# Patient Record
Sex: Male | Born: 2000 | Race: Black or African American | Hispanic: No | Marital: Single | Smoking: Never smoker
Health system: Southern US, Community
[De-identification: ages and names within clinical notes are randomized; demographics above are authoritative.]

## PROBLEM LIST (undated history)

## (undated) ENCOUNTER — Emergency Department (HOSPITAL_COMMUNITY): Payer: Self-pay | Source: Home / Self Care

---
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
## 2020-03-04 ENCOUNTER — Emergency Department: Payer: Medicaid Other

## 2020-03-04 ENCOUNTER — Other Ambulatory Visit: Payer: Self-pay

## 2020-03-04 ENCOUNTER — Emergency Department
Admission: EM | Admit: 2020-03-04 | Discharge: 2020-03-04 | Disposition: A | Discharge status: Home / Self Care | Payer: Medicaid Other | Attending: Student in an Organized Health Care Education/Training Program | Admitting: Student in an Organized Health Care Education/Training Program

## 2020-03-04 DIAGNOSIS — M79605 Pain in left leg: Secondary | ICD-10-CM

## 2020-03-04 DIAGNOSIS — M25552 Pain in left hip: Secondary | ICD-10-CM

## 2020-03-04 NOTE — ED Provider Notes (Signed)
[facility] Emergency Department Provider Note   ____________________________________________   First MD Initiated Contact with Patient 03/04/20 1119     (approximate)  I have reviewed the triage vital signs and the nursing notes.   HISTORY  Chief Complaint Leg Pain   HPI Itay Mella is a 19 y.o. male presents to the ED with complaint of left thigh pain that started a few days ago. Patient states that it got better and then started hurting again this morning. Patient states he took ibuprofen 800 mg last evening. He denies any known injury to his leg. He states he has been doing a lot more walking than usual.         History reviewed. No pertinent past medical history.  There are no problems to display for this patient.   History reviewed. No pertinent surgical history.  Prior to Admission medications   Medication Sig Start Date End Date Taking? Authorizing Provider  fluticasone (FLONASE) 50 MCG/ACT nasal spray Place 1 spray into both nostrils 2 (two) times daily. 06/11/16 03/04/20  Hagler, Jami L, PA-C    Allergies Patient has no known allergies.  History reviewed. No pertinent family history.  Social History Social History   Tobacco Use  . Smoking status: Never Smoker  . Smokeless tobacco: Never Used  Substance Use Topics  . Alcohol use: No  . Drug use: No    Review of Systems Constitutional: No fever/chills Eyes: No visual changes. Cardiovascular: Denies chest pain. Respiratory: Denies shortness of breath. Gastrointestinal: No abdominal pain.  No nausea, no vomiting.  Genitourinary: Negative for dysuria. Musculoskeletal: Positive left hip/proximal femur pain. Skin: Negative for rash. Neurological: Negative for headaches, focal weakness or numbness.  ____________________________________________   PHYSICAL EXAM:  VITAL SIGNS: ED Triage Vitals [03/04/20 1113]  Enc Vitals Group     BP (!) 146/85     Pulse Rate 70      Resp 16     Temp 98.2 F (36.8 C)     Temp Source Oral     SpO2 97 %     Weight 150 lb (68 kg)     Height 5\' 7"  (1.702 m)     Head Circumference      Peak Flow      Pain Score 5     Pain Loc      Pain Edu?      Excl. in GC?    Constitutional: Alert and oriented. Well appearing and in no acute distress. Eyes: Conjunctivae are normal.  Head: Atraumatic. Neck: No stridor.   Cardiovascular: Normal rate, regular rhythm. Grossly normal heart sounds.  Good peripheral circulation. Respiratory: Normal respiratory effort.  No retractions. Lungs CTAB. Musculoskeletal: Examination of the left hip and thigh there is no gross deformity or soft tissue edema.  No evidence of injury.  Patient is able to flex and extend without difficulty and no difficulty was noted as patient was able to abduct and abduct.. Neurologic:  Normal speech and language. No gross focal neurologic deficits are appreciated.  Skin:  Skin is warm, dry and intact. No rash noted. Psychiatric: Mood and affect are normal. Speech and behavior are normal.  ____________________________________________   LABS (all labs ordered are listed, but only abnormal results are displayed)  Labs Reviewed - No data to display  RADIOLOGY   Official radiology report(s): DG HIP UNILAT WITH PELVIS 2-3 VIEWS LEFT  Result Date: 03/04/2020 CLINICAL DATA:  Left hip and thigh pain for a few days. No  known injury. EXAM: DG HIP (WITH OR WITHOUT PELVIS) 2-3V LEFT COMPARISON:  None. FINDINGS: There is no evidence of hip fracture or dislocation. There is no evidence of arthropathy or other focal bone abnormality. IMPRESSION: Normal exam. Electronically Signed   By: Drusilla Kanner M.D.   On: 03/04/2020 12:04    ____________________________________________   PROCEDURES  Procedure(s) performed (including Critical Care):  Procedures   ____________________________________________   INITIAL IMPRESSION / ASSESSMENT AND PLAN / ED COURSE  As  part of my medical decision making, I reviewed the following data within the electronic MEDICAL RECORD NUMBER Notes from prior ED visits and [place] Controlled Substance Database  19 year old male presents to the ED with complaint of left upper thigh and hip pain without history of injury.  Patient states that he took ibuprofen once last evening and is not having as much pain as he has for the last 3 days.  Physical exam was benign.  X-ray was negative and patient was made aware.  He will continue taking ibuprofen at home every 8 hours with food.  He is to follow-up with his PCP if any continued problems.  ____________________________________________   FINAL CLINICAL IMPRESSION(S) / ED DIAGNOSES  Final diagnoses:  Left leg pain     ED Discharge Orders    None       Note:  This document was prepared using Dragon voice recognition software and may include unintentional dictation errors.    Tommi Rumps, PA-C 03/04/20 1321    Willy Eddy, MD 03/04/20 1527

## 2020-03-04 NOTE — ED Triage Notes (Signed)
Pt A&O, ambulatory. C/o L thigh pain that started a few days ago, went away, and came back this morning. Denies injury.

## 2020-03-04 NOTE — Discharge Instructions (Signed)
Follow-up with your primary care provider if any continued problems.  Continue taking ibuprofen every 8 hours as needed for leg pain.  You may try using ice or heat to your muscles as needed for discomfort.

## 2020-03-04 NOTE — ED Notes (Signed)
Pt taken to xray via stretcher  

## 2021-03-27 IMAGING — CR DG HIP (WITH OR WITHOUT PELVIS) 2-3V*L*
1 series · 3 of 3 positions shown · non-contrast
Comparison: None.

CLINICAL DATA: Left hip and thigh pain for a few days. No known
injury.

EXAM:
DG HIP (WITH OR WITHOUT PELVIS) 2-3V LEFT

[Series 1: dg hip unilat w or w/o pelvis 1v left · non-contrast · 0.14mm/px · 3 of 3 slices shown]
[im 1/3]
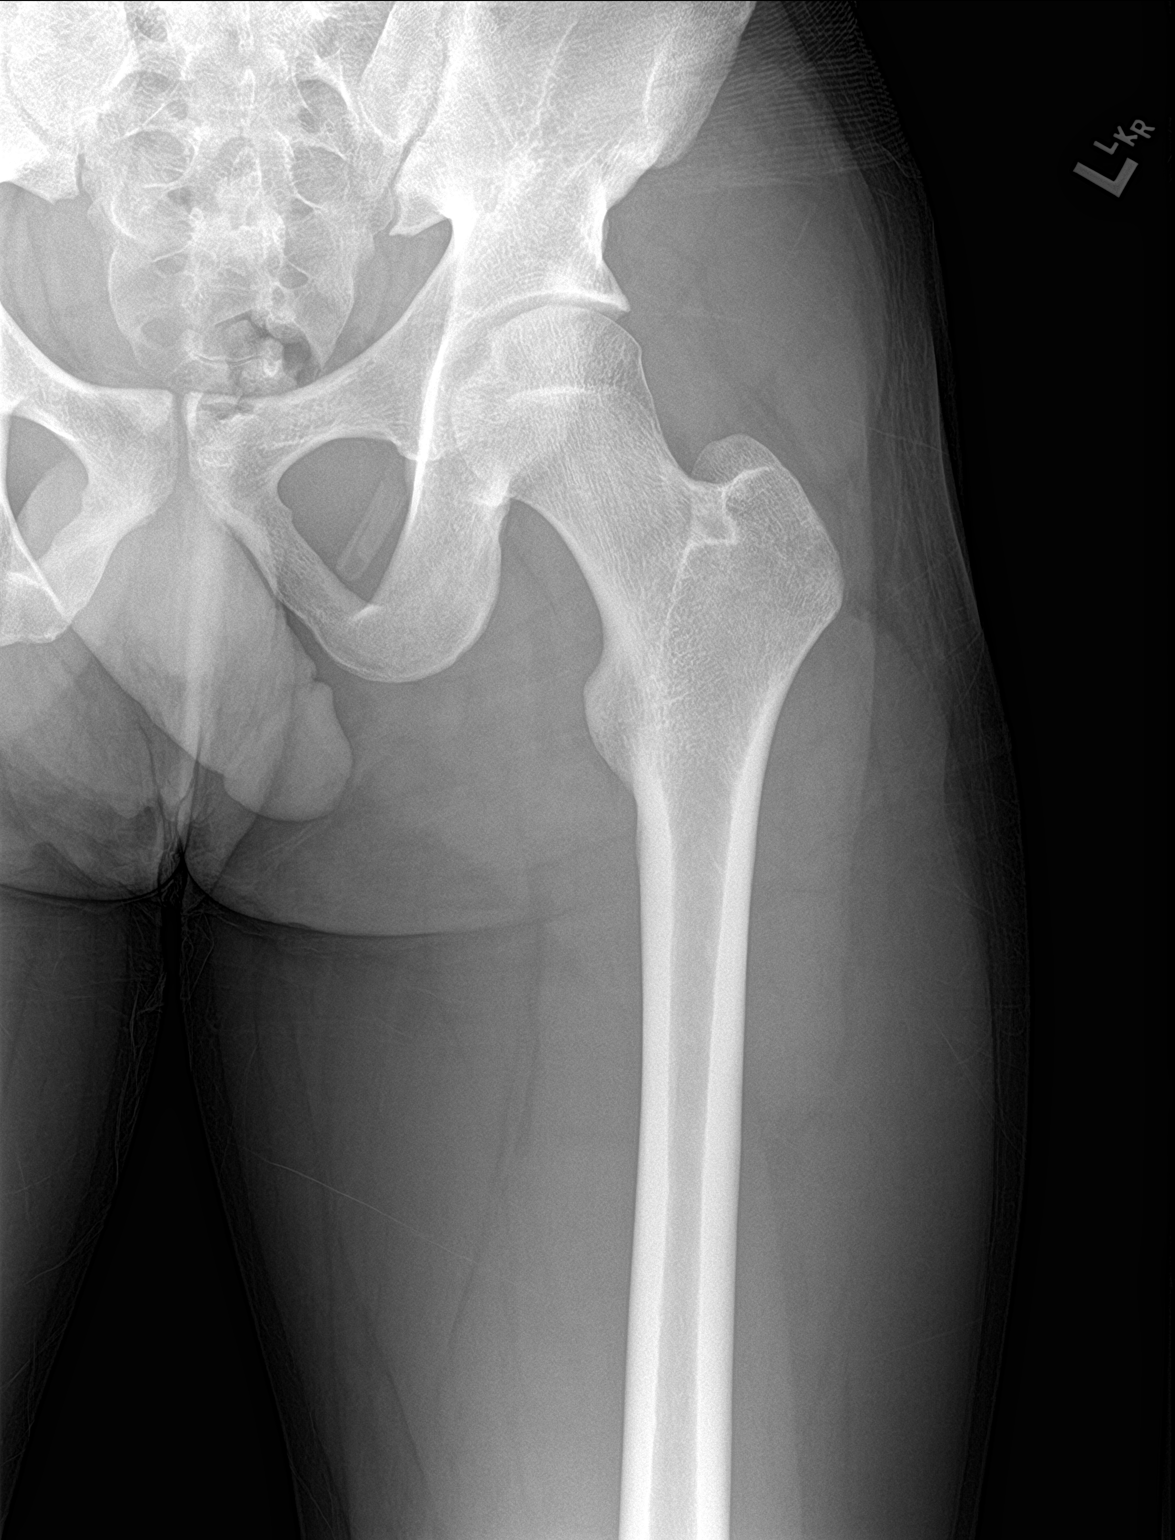
[im 2/3]
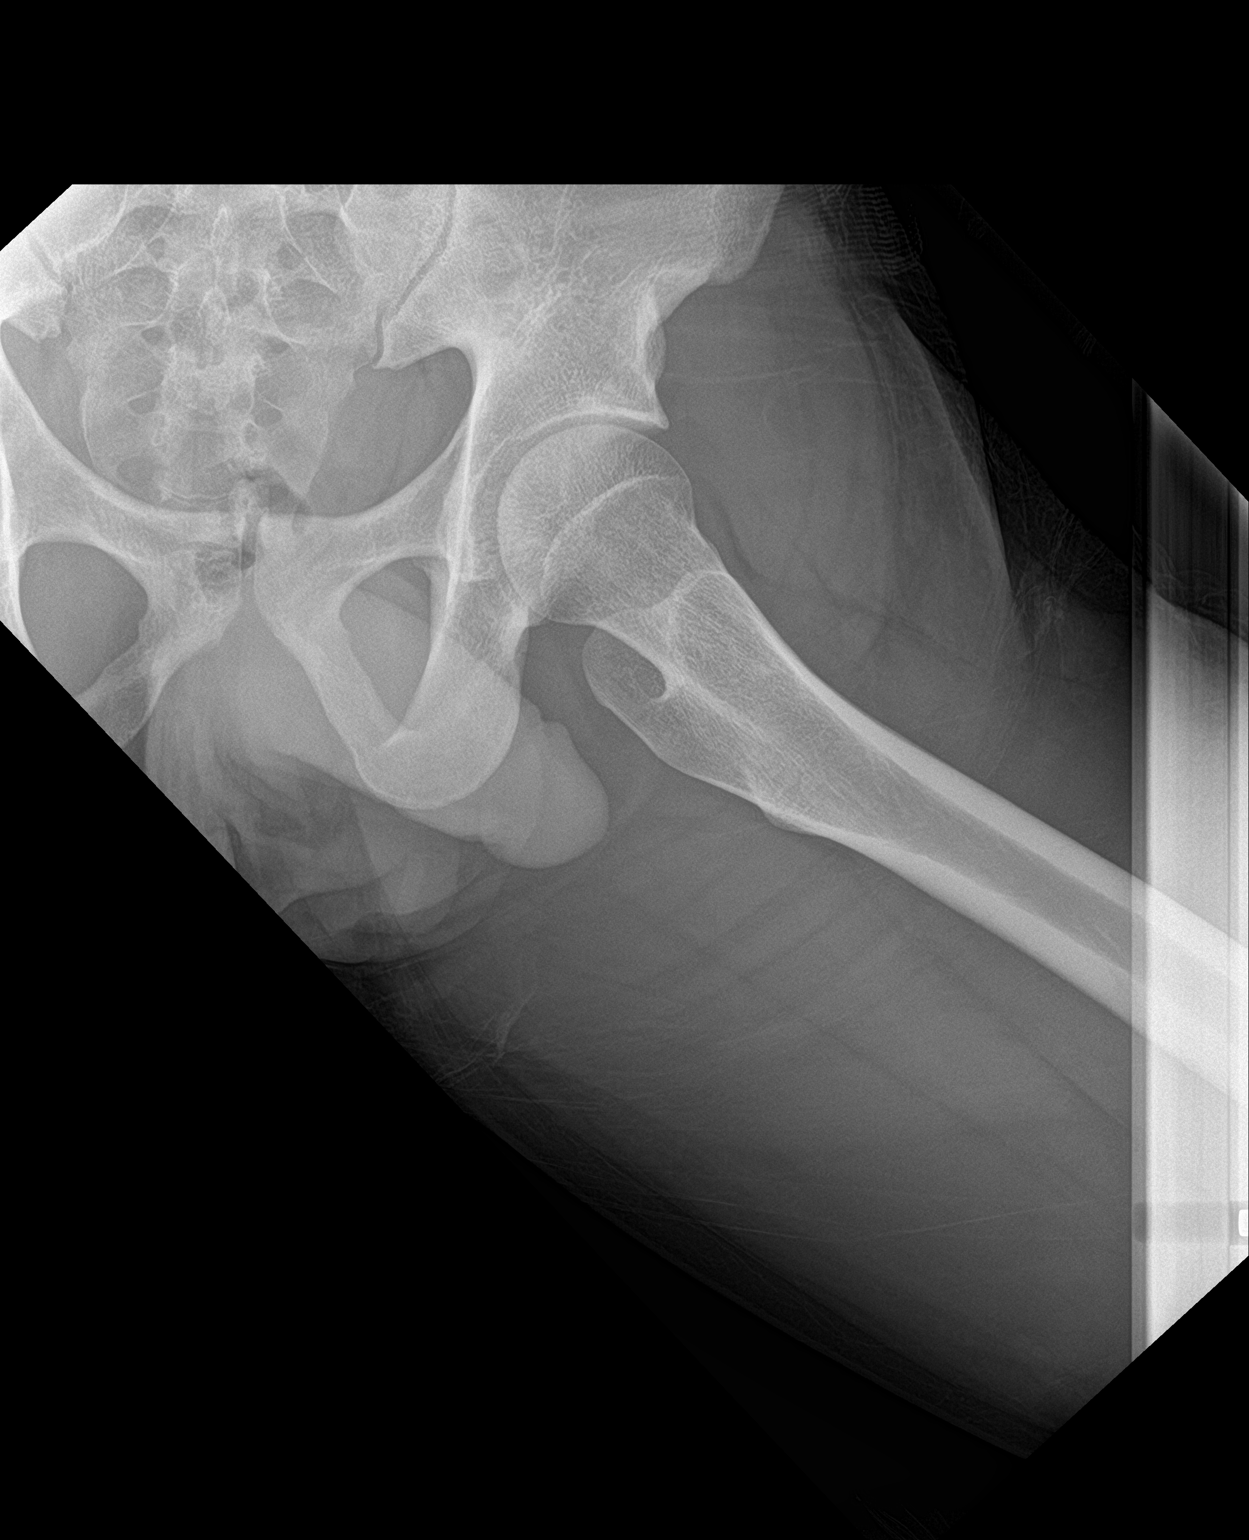
[im 3/3]
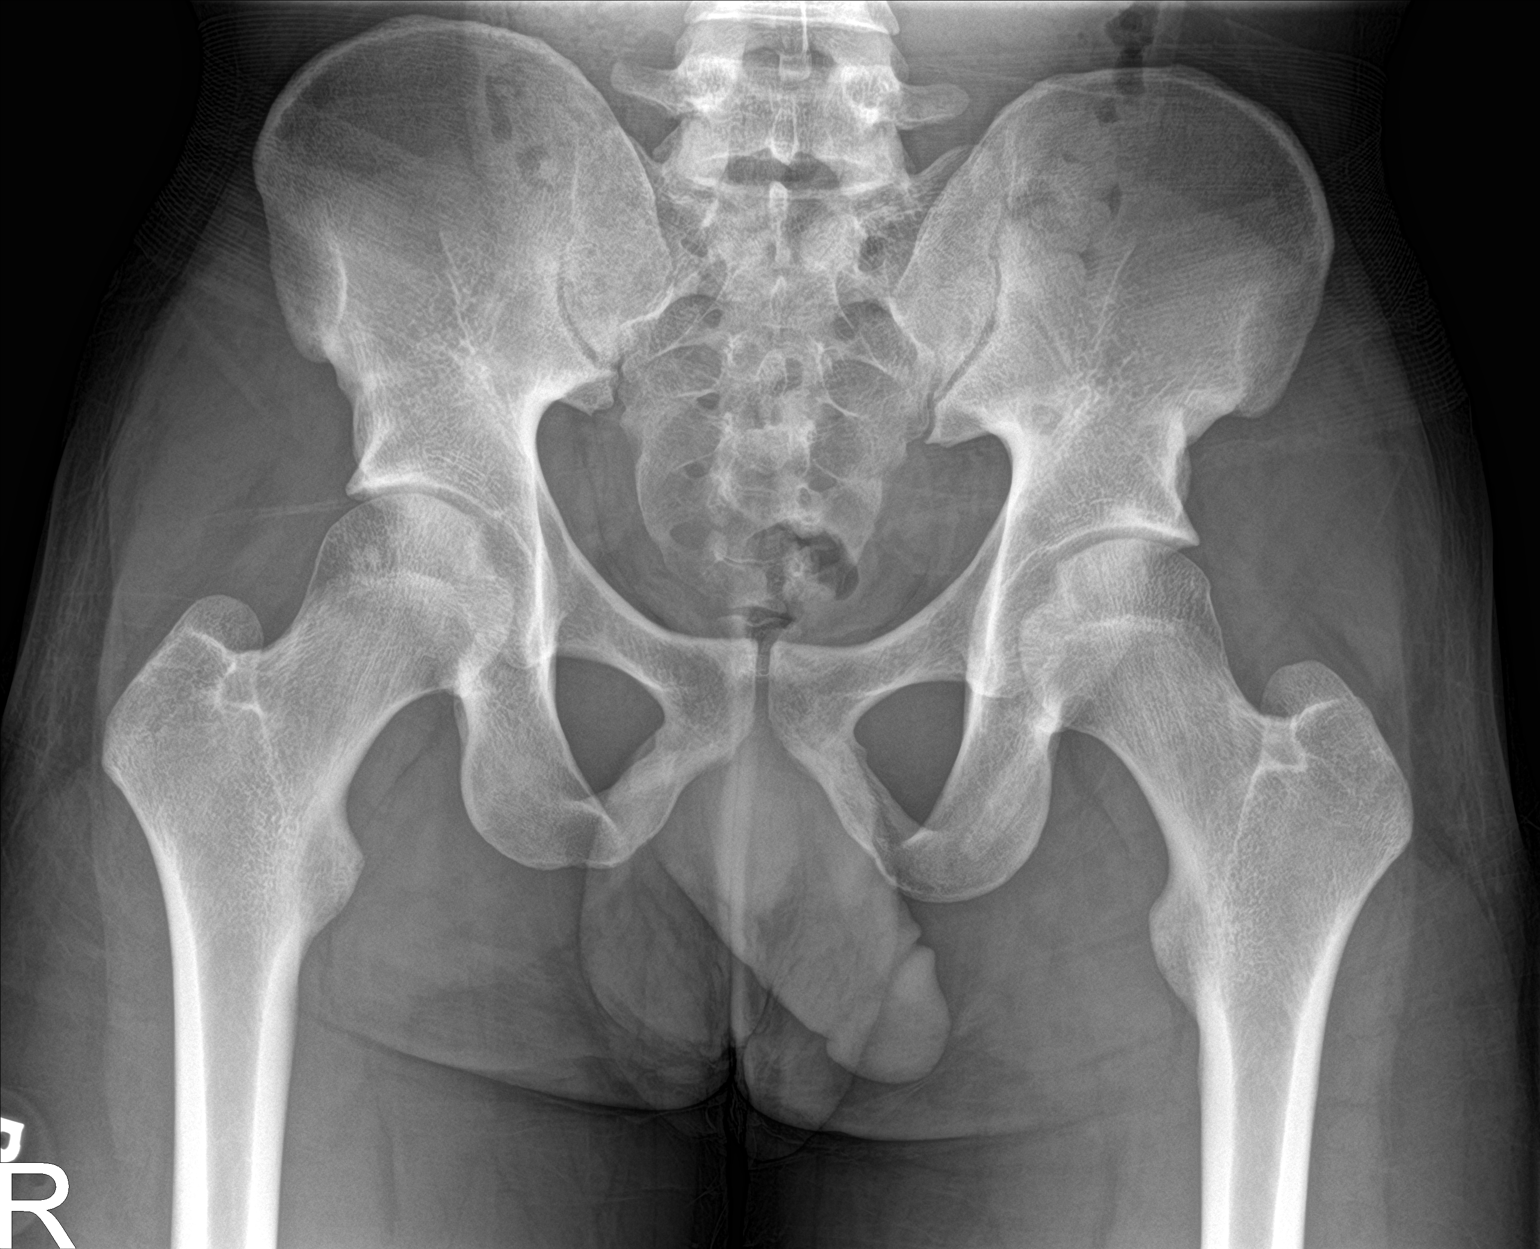

[3 of 3 positions shown; findings below may reference images not displayed]

FINDINGS: There is no evidence of hip fracture or dislocation. There is no
evidence of arthropathy or other focal bone abnormality.
IMPRESSION: Normal exam.

## 2022-03-25 ENCOUNTER — Emergency Department
Admission: EM | Admit: 2022-03-25 | Discharge: 2022-03-25 | Disposition: A | Discharge status: Home / Self Care | Payer: Medicaid Other | Attending: Emergency Medicine | Admitting: Emergency Medicine

## 2022-03-25 ENCOUNTER — Other Ambulatory Visit: Payer: Self-pay

## 2022-03-25 DIAGNOSIS — M546 Pain in thoracic spine: Secondary | ICD-10-CM | POA: Diagnosis present

## 2022-03-25 DIAGNOSIS — S29012A Strain of muscle and tendon of back wall of thorax, initial encounter: Secondary | ICD-10-CM

## 2022-03-25 DIAGNOSIS — S29019A Strain of muscle and tendon of unspecified wall of thorax, initial encounter: Secondary | ICD-10-CM | POA: Diagnosis not present

## 2022-03-25 DIAGNOSIS — X500XXA Overexertion from strenuous movement or load, initial encounter: Secondary | ICD-10-CM | POA: Diagnosis not present

## 2022-03-25 MED ORDER — KETOROLAC TROMETHAMINE 30 MG/ML IJ SOLN
30.0000 mg | Freq: Once | INTRAMUSCULAR | Status: AC
  Administered 2022-03-25: 30 mg via INTRAMUSCULAR
  Filled 2022-03-25: qty 1

## 2022-03-25 MED ORDER — ACETAMINOPHEN 500 MG PO TABS
1000.0000 mg | ORAL_TABLET | Freq: Once | ORAL | Status: AC
  Administered 2022-03-25: 1000 mg via ORAL
  Filled 2022-03-25: qty 2

## 2022-03-25 MED ORDER — METHOCARBAMOL 500 MG PO TABS: 500.0000 mg | ORAL_TABLET | Freq: Three times a day (TID) | ORAL | 0 refills | Status: DC | PRN

## 2022-03-25 MED ORDER — METHOCARBAMOL 500 MG PO TABS
500.0000 mg | ORAL_TABLET | Freq: Once | ORAL | Status: AC
  Administered 2022-03-25: 500 mg via ORAL
  Filled 2022-03-25: qty 1

## 2022-03-25 NOTE — Discharge Instructions (Addendum)
Please take Tylenol and ibuprofen/Advil for your pain.  It is safe to take them together, or to alternate them every few hours.  Take up to 1000mg  of Tylenol at a time, up to 4 times per day.  Do not take more than 4000 mg of Tylenol in 24 hours.  For ibuprofen, take 400-600 mg, 3 - 4 times per day.  Use the methocarbamol/Robaxin muscle relaxer as needed for more severe spasms.  This medication may make you little sleepy so be careful and do not drive or operate machinery with this.

## 2022-03-25 NOTE — ED Provider Notes (Signed)
[facility] Provider Note    Event Date/Time   First MD Initiated Contact with Patient 03/25/22 [phone]     (approximate)   History   Back Pain   HPI  Gary Graves is a 21 y.o. male who presents to the ED for evaluation of Back Pain   Patient presents to the ED with his father and sister for the evaluation of subacute thoracic back pain.  He reports no discrete injuries, but does admit to recently starting deadlift weight lifting 2 days prior to the initiation of his pain.  He denies any falls, MVC or discrete injuries.  Reports that he is not familiar with deadlifts, but in the span of 1 or 2 days he went from trying to learn to trying to dead lift over 300 pounds.  He reports thoracic back pain since that time, worse at night when he is trying to sleep, and improved during the daytime when he is up and moving.  Denies any fever, IVDU, urinary or stool retention or saddle anesthesias.   Physical Exam   Triage Vital Signs: ED Triage Vitals [03/25/22 0429]  Enc Vitals Group     BP (!) 136/90     Pulse Rate 77     Resp 16     Temp 97.8 F (36.6 C)     Temp src      SpO2 100 %     Weight 175 lb (79.4 kg)     Height 5\' 8"  (1.727 m)     Head Circumference      Peak Flow      Pain Score 7     Pain Loc      Pain Edu?      Excl. in [place]?     Most recent vital signs: Vitals:   03/25/22 0429  BP: (!) 136/90  Pulse: 77  Resp: 16  Temp: 97.8 F (36.6 C)  SpO2: 100%    General: Awake, no distress.  Pleasant and conversational.  Well-appearing.  Ambulatory with normal gait.  Springs up from the stretcher himself to stand and able to take his zip up putting off by himself without difficulty CV:  Good peripheral perfusion.  Resp:  Normal effort.  Abd:  No distention.  MSK:  No deformity noted.  Minimal paraspinal thoracic tenderness to palpation without any midline tenderness, step-offs or signs of trauma to the back. Neuro:  No focal deficits  appreciated. Cranial nerves II through XII intact 5/5 strength and sensation in all 4 extremities Other:     ED Results / Procedures / Treatments   Labs (all labs ordered are listed, but only abnormal results are displayed) Labs Reviewed - No data to display  EKG   RADIOLOGY   Official radiology report(s): No results found.  PROCEDURES and INTERVENTIONS:  Procedures  Medications  ketorolac (TORADOL) 30 MG/ML injection 30 mg (has no administration in time range)  methocarbamol (ROBAXIN) tablet 500 mg (has no administration in time range)  acetaminophen (TYLENOL) tablet 1,000 mg (has no administration in time range)     IMPRESSION / MDM / ASSESSMENT AND PLAN / ED COURSE  I reviewed the triage vital signs and the nursing notes.  Differential diagnosis includes, but is not limited to, thoracic strain, cauda equina syndrome, cord compression, PE  {Patient presents with symptoms of an acute illness or injury that is potentially life-threatening.  Otherwise healthy 21 year old male presents to the ED with atraumatic thoracic back pain shortly, likely a  muscular strain and is suitable for outpatient management nonnarcotic multimodal analgesia.  I educated the patient on safe weightlifting practices and to ensure he practices his forearm thoroughly and slowly advance to help prevent injury.  I provided a prescription for methocarbamol and we discussed Tylenol and ibuprofen.  We discussed appropriate return precautions for the ED and he is suitable for outpatient management.      FINAL CLINICAL IMPRESSION(S) / ED DIAGNOSES   Final diagnoses:  Strain of thoracic back region     Rx / DC Orders   ED Discharge Orders          Ordered    methocarbamol (ROBAXIN) 500 MG tablet  Every 8 hours PRN        03/25/22 0537             Note:  This document was prepared using Dragon voice recognition software and may include unintentional dictation errors.   Delton Prairie,  MD 03/25/22 [phone]

## 2022-03-25 NOTE — ED Triage Notes (Signed)
Pt states mid back pain for a week. Pt states pain is worse at night and feels better when he is moving around. Pt denies cough, shob, fever. Pt ambulatory without distress.

## 2022-09-04 ENCOUNTER — Other Ambulatory Visit: Payer: Self-pay

## 2022-09-04 ENCOUNTER — Emergency Department: Payer: Medicaid Other

## 2022-09-04 ENCOUNTER — Emergency Department
Admission: EM | Admit: 2022-09-04 | Discharge: 2022-09-04 | Disposition: A | Discharge status: Home / Self Care | Payer: Medicaid Other | Attending: Emergency Medicine | Admitting: Emergency Medicine

## 2022-09-04 DIAGNOSIS — M25452 Effusion, left hip: Secondary | ICD-10-CM | POA: Insufficient documentation

## 2022-09-04 DIAGNOSIS — M25552 Pain in left hip: Secondary | ICD-10-CM | POA: Diagnosis present

## 2022-09-04 LAB — CBC WITH DIFFERENTIAL/PLATELET
Abs Immature Granulocytes: 0.02 10*3/uL (ref 0.00–0.07)
Basophils Absolute: 0 10*3/uL (ref 0.0–0.1)
Basophils Relative: 0 %
Eosinophils Absolute: 0.3 10*3/uL (ref 0.0–0.5)
Eosinophils Relative: 4 %
HCT: 40 % (ref 39.0–52.0)
Hemoglobin: 13.4 g/dL (ref 13.0–17.0)
Immature Granulocytes: 0 %
Lymphocytes Relative: 30 %
Lymphs Abs: 1.9 10*3/uL (ref 0.7–4.0)
MCH: 28 pg (ref 26.0–34.0)
MCHC: 33.5 g/dL (ref 30.0–36.0)
MCV: 83.5 fL (ref 80.0–100.0)
Monocytes Absolute: 0.5 10*3/uL (ref 0.1–1.0)
Monocytes Relative: 7 %
Neutro Abs: 3.6 10*3/uL (ref 1.7–7.7)
Neutrophils Relative %: 59 %
Platelets: 246 10*3/uL (ref 150–400)
RBC: 4.79 MIL/uL (ref 4.22–5.81)
RDW: 11.9 % (ref 11.5–15.5)
WBC: 6.2 10*3/uL (ref 4.0–10.5)
nRBC: 0 % (ref 0.0–0.2)

## 2022-09-04 LAB — SEDIMENTATION RATE: Sed Rate: 14 mm/hr (ref 0–15)

## 2022-09-04 LAB — BASIC METABOLIC PANEL
Anion gap: 7 (ref 5–15)
BUN: 17 mg/dL (ref 6–20)
CO2: 27 mmol/L (ref 22–32)
Calcium: 9.3 mg/dL (ref 8.9–10.3)
Chloride: 104 mmol/L (ref 98–111)
Creatinine, Ser: 0.95 mg/dL (ref 0.61–1.24)
GFR, Estimated: >60 mL/min (ref >60)
Glucose, Bld: 84 mg/dL (ref 70–99)
Potassium: 3.8 mmol/L (ref 3.5–5.1)
Sodium: 138 mmol/L (ref 135–145)

## 2022-09-04 LAB — CK: Total CK: 241 U/L (ref 49–397)

## 2022-09-04 MED ORDER — HYDROCODONE-ACETAMINOPHEN 5-325 MG PO TABS: 1.0000 | ORAL_TABLET | Freq: Four times a day (QID) | ORAL | 0 refills | Status: DC | PRN

## 2022-09-04 MED ORDER — KETOROLAC TROMETHAMINE 30 MG/ML IJ SOLN
15.0000 mg | Freq: Once | INTRAMUSCULAR | Status: AC
  Administered 2022-09-04: 15 mg via INTRAVENOUS
  Filled 2022-09-04: qty 1

## 2022-09-04 MED ORDER — NAPROXEN 500 MG PO TABS: 500.0000 mg | ORAL_TABLET | Freq: Two times a day (BID) | ORAL | 0 refills | Status: DC

## 2022-09-04 NOTE — ED Triage Notes (Signed)
Complaining of left hip pain, started 4 days ago, denies any injury, hurts to move it whether he is walking or sitting.

## 2022-09-04 NOTE — ED Provider Notes (Signed)
[facility] Provider Note    Event Date/Time   First MD Initiated Contact with Patient 09/04/22 [phone]     (approximate)   History   Hip Pain   HPI  Gary Graves is a 22 y.o. male who presents to the ED from home with a chief complaint of nontraumatic left hip pain which began 4 days ago.  Patient awoke tonight with pain.  Hurts to move with any position, walking or sitting.  Denies fever/chills, chest pain, shortness of breath, abdominal pain, nausea or vomiting.  Does lift heavy things at work.  Took a car ride 6 hours to [place] 2 weeks ago.  Denies fall/injury/trauma.     Past Medical History  History reviewed. No pertinent past medical history.   Active Problem List  There are no problems to display for this patient.    Past Surgical History  History reviewed. No pertinent surgical history.   Home Medications   Prior to Admission medications   Medication Sig Start Date End Date Taking? Authorizing Provider  methocarbamol (ROBAXIN) 500 MG tablet Take 1 tablet (500 mg total) by mouth every 8 (eight) hours as needed for muscle spasms. 03/25/22   Vladimir Crofts, MD  fluticasone (FLONASE) 50 MCG/ACT nasal spray Place 1 spray into both nostrils 2 (two) times daily. 06/11/16 03/04/20  Hagler, Jami L, PA-C     Allergies  Patient has no known allergies.   Family History  History reviewed. No pertinent family history.   Physical Exam  Triage Vital Signs: ED Triage Vitals  Enc Vitals Group     BP 09/04/22 0359 (!) 138/91     Pulse Rate 09/04/22 0359 81     Resp 09/04/22 0359 20     Temp 09/04/22 0359 97.8 F (36.6 C)     Temp Source 09/04/22 0359 Oral     SpO2 09/04/22 0359 97 %     Weight 09/04/22 0401 170 lb (77.1 kg)     Height 09/04/22 0401 5\' 7"  (1.702 m)     Head Circumference --      Peak Flow --      Pain Score 09/04/22 0418 8     Pain Loc --      Pain Edu? --      Excl. in [place]? --     Updated Vital Signs: BP 134/82   Pulse  77   Temp 97.8 F (36.6 C) (Oral)   Resp 18   Ht 5\' 7"  (1.702 m)   Wt 77.1 kg   SpO2 100%   BMI 26.63 kg/m    General: Awake, no distress.  CV:  RRR.  Good peripheral perfusion.  Resp:  Normal effort.  TAB. Abd:  No distention.  Other:  Pelvis is stable.  Left inner thigh tender to palpation.  No inguinal hernia palpated.  2+ femoral pulse.  Full range of motion left hip with some pain.  Calf is supple without tenderness to palpation.   ED Results / Procedures / Treatments  Labs (all labs ordered are listed, but only abnormal results are displayed) Labs Reviewed  BASIC METABOLIC PANEL  CK  CBC WITH DIFFERENTIAL/PLATELET  CBC WITH DIFFERENTIAL/PLATELET  SEDIMENTATION RATE     EKG  None   RADIOLOGY I have independently visualized and interpreted patient's x-ray and ultrasound as well as noted the radiology interpretation:  Left hip x-ray: Negative  DVT ultrasound: No DVT; small left hip joint effusion  Official radiology report(s): US Venous Img  Lower Unilateral Left (DVT)  Result Date: 09/04/2022 CLINICAL DATA:  Hip pain after running. EXAM: LEFT LOWER EXTREMITY VENOUS DOPPLER ULTRASOUND TECHNIQUE: Gray-scale sonography with compression, as well as color and duplex ultrasound, were performed to evaluate the deep venous system(s) from the level of the common femoral vein through the popliteal and proximal calf veins. COMPARISON:  None Available. FINDINGS: VENOUS Normal compressibility of the common femoral, superficial femoral, and popliteal veins, as well as the visualized calf veins. Visualized portions of profunda femoral vein and great saphenous vein unremarkable. No filling defects to suggest DVT on grayscale or color Doppler imaging. Doppler waveforms show normal direction of venous flow, normal respiratory plasticity and response to augmentation. Limited views of the contralateral common femoral vein are unremarkable. OTHER Small joint effusion noted within the left  hip Limitations: none IMPRESSION: 1. No evidence of left lower extremity DVT. 2. Small left hip joint effusion. Electronically Signed   By: Kerby Moors M.D.   On: 09/04/2022 06:10   DG Hip Unilat With Pelvis 2-3 Views Left  Result Date: 09/04/2022 CLINICAL DATA:  Pain within the left hip. EXAM: DG HIP (WITH OR WITHOUT PELVIS) 2-3V LEFT COMPARISON:  03/04/2020 FINDINGS: No signs of acute fracture or dislocation. No evidence for pelvic diastasis. No significant arthropathy identified or other focal bone abnormality. IMPRESSION: Negative. Electronically Signed   By: Kerby Moors M.D.   On: 09/04/2022 05:39     PROCEDURES:  Critical Care performed: No  Procedures   MEDICATIONS ORDERED IN ED: Medications  ketorolac (TORADOL) 30 MG/ML injection 15 mg (15 mg Intravenous Given 09/04/22 0554)     IMPRESSION / MDM / ASSESSMENT AND PLAN / ED COURSE  I reviewed the triage vital signs and the nursing notes.                             22 year old male presenting with nontraumatic left hip/thigh pain.  Differential diagnosis includes but is not limited to fracture/dislocation, infectious/inflammatory, DVT, etc.  I have personally reviewed patient's records and note his last visit was in the ED in August 2023 for thoracic back pain.  Patient's presentation is most consistent with acute presentation with potential threat to life or bodily function.  Will obtain lab work including sed rate, DVT ultrasound.  Administer IV ketorolac and reassess.  Clinical Course as of 09/04/22 0651  Mon Sep 04, 2022  8416 Pain improved.  Patient was able to get a nap.  Updated patient on all lab work and imaging studies which are unremarkable.  Pending sed rate which would likely be unremarkable.  Will discharge home with as needed prescriptions for Naprosyn and Norco and patient will follow-up with orthopedics.  Tricked return precautions given.  Patient verbalizes understanding and agrees with plan of care. [JS]     Clinical Course User Index [JS] Paulette Blanch, MD     FINAL CLINICAL IMPRESSION(S) / ED DIAGNOSES   Final diagnoses:  Left hip pain  Hip joint effusion, left     Rx / DC Orders   ED Discharge Orders     None        Note:  This document was prepared using Dragon voice recognition software and may include unintentional dictation errors.   Paulette Blanch, MD 09/04/22 [phone]

## 2022-09-04 NOTE — Discharge Instructions (Addendum)
Take Naprosyn twice daily as prescribed for pain.  You may take Norco as needed for more severe pain.  Return to the ER for worsening symptoms, fever, redness/swelling or other concerns.

## 2023-01-12 ENCOUNTER — Ambulatory Visit
Admission: EM | Admit: 2023-01-12 | Discharge: 2023-01-12 | Disposition: A | Discharge status: Home / Self Care | Payer: Medicaid Other | Attending: Urgent Care | Admitting: Urgent Care

## 2023-01-12 DIAGNOSIS — Z23 Encounter for immunization: Secondary | ICD-10-CM | POA: Diagnosis not present

## 2023-01-12 DIAGNOSIS — S91331A Puncture wound without foreign body, right foot, initial encounter: Secondary | ICD-10-CM

## 2023-01-12 MED ORDER — TETANUS-DIPHTH-ACELL PERTUSSIS 5-2.5-18.5 LF-MCG/0.5 IM SUSY
0.5000 mL | PREFILLED_SYRINGE | Freq: Once | INTRAMUSCULAR | Status: AC
  Administered 2023-01-12: 0.5 mL via INTRAMUSCULAR

## 2023-01-12 MED ORDER — LEVOFLOXACIN 500 MG PO TABS: 500.0000 mg | ORAL_TABLET | Freq: Every day | ORAL | 0 refills | Status: AC

## 2023-01-12 NOTE — ED Triage Notes (Signed)
Patient presents to [facility] for right foot injury today. He states he stepped on a nail this morning. Last Tdap unknown.

## 2023-01-12 NOTE — ED Provider Notes (Signed)
Gary Graves    CSN: [id number] Arrival date & time: 01/12/23  1304      History   Chief Complaint Chief Complaint  Patient presents with   Foot Injury    HPI Gary Graves is a 22 y.o. male.   HPI  Patient presents to urgent care after right foot injury today when he stepped on a nail and punctured his foot.  Last tetanus unknown.  History reviewed. No pertinent past medical history.  There are no problems to display for this patient.   History reviewed. No pertinent surgical history.     Home Medications    Prior to Admission medications   Medication Sig Start Date End Date Taking? Authorizing Provider  HYDROcodone-acetaminophen (NORCO) 5-325 MG tablet Take 1 tablet by mouth every 6 (six) hours as needed for moderate pain. 09/04/22   Irean Hong, MD  methocarbamol (ROBAXIN) 500 MG tablet Take 1 tablet (500 mg total) by mouth every 8 (eight) hours as needed for muscle spasms. 03/25/22   Delton Prairie, MD  naproxen (NAPROSYN) 500 MG tablet Take 1 tablet (500 mg total) by mouth 2 (two) times daily with a meal. 09/04/22   Irean Hong, MD  fluticasone (FLONASE) 50 MCG/ACT nasal spray Place 1 spray into both nostrils 2 (two) times daily. 06/11/16 03/04/20  Hagler, Ernestene Kiel, PA-C    Family History History reviewed. No pertinent family history.  Social History Social History   Tobacco Use   Smoking status: Never   Smokeless tobacco: Never  Substance Use Topics   Alcohol use: No   Drug use: No     Allergies   Patient has no known allergies.   Review of Systems Review of Systems   Physical Exam Triage Vital Signs ED Triage Vitals [01/12/23 1320]  Enc Vitals Group     BP 122/82     Pulse Rate 92     Resp 18     Temp 98.4 F (36.9 C)     Temp Source Oral     SpO2 98 %     Weight      Height      Head Circumference      Peak Flow      Pain Score      Pain Loc      Pain Edu?      Excl. in [place]?    No data found.  Updated Vital Signs BP  122/82 (BP Location: Left Arm)   Pulse 92   Temp 98.4 F (36.9 C) (Oral)   Resp 18   SpO2 98%   Visual Acuity Right Eye Distance:   Left Eye Distance:   Bilateral Distance:    Right Eye Near:   Left Eye Near:    Bilateral Near:     Physical Exam   UC Treatments / Results  Labs (all labs ordered are listed, but only abnormal results are displayed) Labs Reviewed - No data to display  EKG   Radiology No results found.  Procedures Procedures (including critical care time)  Medications Ordered in UC Medications - No data to display  Initial Impression / Assessment and Plan / UC Course  I have reviewed the triage vital signs and the nursing notes.  Pertinent labs & imaging results that were available during my care of the patient were reviewed by me and considered in my medical decision making (see chart for details).   Puncture wound to plantar surface of right foot.  Wound needed  with normal saline and Cleaned with Betadine.  treating prophylactically with Staph aureus, strep and P. aeruginosa coverage per protocol with levofloxacin.  Td booster administered.  Reviewed chart history.   Counseled patient on potential for adverse effects with medications prescribed/recommended today, ER and return-to-clinic precautions discussed, patient verbalized understanding and agreement with care plan.    Final Clinical Impressions(s) / UC Diagnoses   Final diagnoses:  None   Discharge Instructions   None    ED Prescriptions   None    PDMP not reviewed this encounter.   Charma Igo, [place] 01/12/23 1330

## 2023-01-12 NOTE — Discharge Instructions (Signed)
Follow up here or with your primary care provider if your symptoms are worsening or not improving.     

## 2023-10-17 ENCOUNTER — Ambulatory Visit: Payer: Medicaid Other | Attending: Primary Care

## 2023-10-17 DIAGNOSIS — M5459 Other low back pain: Secondary | ICD-10-CM | POA: Diagnosis present

## 2023-10-17 DIAGNOSIS — M25551 Pain in right hip: Secondary | ICD-10-CM | POA: Insufficient documentation

## 2023-10-17 DIAGNOSIS — M6281 Muscle weakness (generalized): Secondary | ICD-10-CM | POA: Diagnosis present

## 2023-10-17 NOTE — Therapy (Signed)
 OUTPATIENT PHYSICAL THERAPY THORACOLUMBAR EVALUATION   Patient Name: Gary Graves MRN: [id number] DOB:May 15, 2001, 23 y.o., male Today's Date: 10/17/2023  END OF SESSION:  PT End of Session - 10/17/23 1918     Visit Number 1    Number of Visits 9    Date for PT Re-Evaluation 12/12/23    PT Start Time 1114    PT Stop Time 1158    PT Time Calculation (min) 44 min    Activity Tolerance Patient tolerated treatment well    Behavior During Therapy [facility] for tasks assessed/performed             History reviewed. No pertinent past medical history. History reviewed. No pertinent surgical history. There are no active problems to display for this patient.   PCP: Sandrea Hughs, NP  REFERRING PROVIDER: Sandrea Hughs, NP  REFERRING DIAG: [phone] (ICD-10-CM) - Right shoulder pain M54.50 (ICD-10-CM) - Low back pain   Rationale for Evaluation and Treatment: Rehabilitation  THERAPY DIAG:  Other low back pain  Pain in right hip  Muscle weakness (generalized)  ONSET DATE: ~1 month ago recent flare up. Has dealt with this for ~4 years.   SUBJECTIVE:                                                                                                                                                                                           SUBJECTIVE STATEMENT: Pt is a 23 y.o. male referred to OPPT for R shoulder pain and LBP.  PERTINENT HISTORY:  Pt reports R shoulder is no longer an issue. Reports gradual onset of R sided LBP and "sciatica". Gradual onset, no MOI. Noticed symptoms initially with prolonged sitting and pain with getting up. 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 This is still the case with most recent flare up. Normal to have increased pain every few months. Now trying to get it fixed so he doesn't have to keep dealing with flare ups. Reports lower back stiffness and onset of sharp pain in posterolateral glutes. Reports referred pain to knee  posteriorly and laterally. Sharp pain also there. Painful activities include prolonged sitting and walking on inclines. Pain begins with sitting for an hour or two. 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 Pain is worse in the mornings. Worst pain described upwards to 5-7/10 NPS. Initially was 9/10 a few weeks ago. Denies N/T and sensation changes. Denies B/B changes. No saddle anesthesia.   PAIN:  Are you having pain? Yes: NPRS scale: none currently Pain location: R hip, lower back  Pain description: Sharp Aggravating factors: Morning time,Prolonged sitting,work tasks, walking on inclines Relieving factors: Activity initially improves  PRECAUTIONS: None  RED FLAGS: None   WEIGHT BEARING RESTRICTIONS: No  FALLS:  Has patient fallen in last 6 months? No  OCCUPATION: Special educational needs teacher  PLOF: Independent  PATIENT GOALS: Improve his pain  NEXT MD VISIT: None  OBJECTIVE:  Note: Objective measures were completed at Evaluation unless otherwise noted.  DIAGNOSTIC FINDINGS:  None   PATIENT SURVEYS:  LEFS 50/80  COGNITION: Overall cognitive status: Within functional limits for tasks assessed     SENSATION: Light touch: WFL  MUSCLE LENGTH: Thomas test: R/L positive for iliopsoas and rectus femoris Ober's: Negative on RLE  POSTURE: No Significant postural limitations  PALPATION: TTP at upper R glut max/med. TTP at R greater trochanter and along IT band   LUMBAR ROM:   AROM eval  Flexion 75%  Extension 100%  Right lateral flexion 100%  Left lateral flexion 100%  Right rotation 100%  Left rotation 100%   (Blank rows = not tested)  LOWER EXTREMITY ROM:     Active  Right eval Left eval  Hip flexion    Hip extension    Hip abduction    Hip adduction    Hip internal rotation    Hip external rotation    Knee flexion    Knee extension    Ankle dorsiflexion    Ankle plantarflexion    Ankle inversion     Ankle eversion     (Blank rows = not tested)  LOWER EXTREMITY MMT:    MMT Right eval Left eval  Hip flexion 5 5  Hip extension 3 4  Hip abduction 3+ 4  Hip adduction    Hip internal rotation 4 4  Hip external rotation 4* 5  Knee flexion 5 5  Knee extension 5 5  Ankle dorsiflexion 5 5  Ankle plantarflexion 5 5  Ankle inversion    Ankle eversion     (Blank rows = not tested)  LUMBAR SPECIAL TESTS:  Straight leg raise test: Negative, Slump test: Negative, and FABER test: Negative FADIR: Positive for concordant pain in R hip  JOINT MOBILITY: Hip A/P: normal and painless  Sacral nutation and counter nutation painless  Lumbar L5-T12 normal and painless  FUNCTIONAL TESTS:  Lateral step down: poor motor control bilaterally with hip IR and adduction leading to knee valgus moment. SLS: 30 sec/LE. No trendelenburg bilaterally Squat: deferred to next session  GAIT: Distance walked: 10 meters Assistive device utilized: None Level of assistance: Complete Independence Comments: normal reciprocal gait  TREATMENT DATE: 10/17/23 Eval only. Brief overview on HEP (reps/sets/frequency)                                                                                                                                 PATIENT EDUCATION:  Education details: HEP, POC Person educated: Patient Education method: Explanation, Demonstration, and Handouts Education comprehension:  verbalized understanding and needs further education  HOME EXERCISE PROGRAM: Access Code: [email or web address] URL: [email or web address] Date: 10/17/2023 Prepared by: Ronnie Derby  Exercises - Sidelying Hip Abduction with Resistance at Thighs  - 1 x daily - 3-4 x weekly - 3 sets - 8 reps - Prone Hip Extension  - 1 x daily - 3-4 x weekly - 3 sets - 8 reps - Half Kneeling Hip Flexor Stretch  - 1 x daily - 7 x weekly - 1 sets - 3 reps - 30 hold  ASSESSMENT:  CLINICAL IMPRESSION: Patient is a 23 y.o. male who  was seen today for physical therapy evaluation and treatment for LBP. Pt's signs/symptoms more consistent with hip pathology versus lumbar pathology. Pt presents with negative slump and SLR with grossly normal and painless lumbar AROM in all planes with no concordant pain. Pt is TTP at greater trochanter and IT band with significantly weaker hip extension and abduction on RLE compared to LLE and hip flexor tightness. These signs/symptoms are more consistent with gluteal tendinitis/GTPS type pain. These deficits are leading to difficulty with side sleeping, working out, and job tasks lifting heavy furniture. Pt will benefit from skilled PT services to address these deficits and maximize return to PLOF.  OBJECTIVE IMPAIRMENTS: decreased mobility, decreased strength, impaired flexibility, improper body mechanics, and pain.   ACTIVITY LIMITATIONS: sitting, standing, and sleeping  PARTICIPATION LIMITATIONS: driving and occupation  PERSONAL FACTORS: Age, Fitness, Past/current experiences, Profession, and Time since onset of injury/illness/exacerbation are also affecting patient's functional outcome.   REHAB POTENTIAL: Fair chronic condition  CLINICAL DECISION MAKING: Stable/uncomplicated  EVALUATION COMPLEXITY: Low   GOALS: Goals reviewed with patient? No  SHORT TERM GOALS: Target date: 11/14/23  Pt will be independent with HEP to improve hip strength and flexibility. Baseline: 10/17/23: Provided initial HEP Goal status: INITIAL  LONG TERM GOALS: Target date: 12/12/23  Pt will improve LEFS by at least 9 points to demonstrate clinically significant reduction in disability due to R hip pain.  Baseline: 10/17/23: 50/80 Goal status: INITIAL  2.  Pt will report worst pain with job related tasks as a 2/10 NPS to demonstrate clinically significant reduction in pain levels. Baseline: 10/17/23: 5-7/10 NPS Goal status: INITIAL  3.  Pt will improve lateral step down without hip adduction/IR to  demonstrate improved hip stability and motor control for pain free recreational activities. Baseline: 10/17/23: hip IR and adduction Goal status: INITIAL PLAN:  PT FREQUENCY: 1-2x/week  PT DURATION: 8 weeks  PLANNED INTERVENTIONS: [id number]- PT Re-evaluation, 97110-Therapeutic exercises, 97530- Therapeutic activity, 97112- Neuromuscular re-education, 97535- Self Care, 52841- Manual therapy, [id number]- Electrical stimulation (unattended), [phone]- Electrical stimulation (manual), Patient/Family education, Balance training, Stair training, Dry Needling, Joint mobilization, Joint manipulation, Spinal manipulation, Spinal mobilization, Cryotherapy, and Moist heat.  PLAN FOR NEXT SESSION: Review HEP. Improve hip strength.   Delphia Grates. Fairly IV, PT, DPT Physical Therapist-   [facility] 10/17/2023, 7:19 PM

## 2023-10-22 ENCOUNTER — Ambulatory Visit: Payer: Medicaid Other

## 2023-10-24 ENCOUNTER — Ambulatory Visit: Payer: Medicaid Other | Attending: Primary Care

## 2023-10-24 DIAGNOSIS — M25551 Pain in right hip: Secondary | ICD-10-CM | POA: Insufficient documentation

## 2023-10-24 DIAGNOSIS — M6281 Muscle weakness (generalized): Secondary | ICD-10-CM | POA: Diagnosis present

## 2023-10-24 DIAGNOSIS — M5459 Other low back pain: Secondary | ICD-10-CM | POA: Diagnosis present

## 2023-10-24 NOTE — Therapy (Signed)
 OUTPATIENT PHYSICAL THERAPY THORACOLUMBAR TREATMENT   Patient Name: Gary Graves MRN: [id number] DOB:Sep 19, 2000, 23 y.o., male Today's Date: 10/24/2023  END OF SESSION:  PT End of Session - 10/24/23 0856     Visit Number 2    Number of Visits 9    Date for PT Re-Evaluation 12/12/23    PT Start Time 0858    PT Stop Time 0940    PT Time Calculation (min) 42 min    Activity Tolerance Patient tolerated treatment well    Behavior During Therapy [facility] for tasks assessed/performed             History reviewed. No pertinent past medical history. History reviewed. No pertinent surgical history. There are no active problems to display for this patient.   PCP: Sandrea Hughs, NP  REFERRING PROVIDER: Sandrea Hughs, NP  REFERRING DIAG: [phone] (ICD-10-CM) - Right shoulder pain M54.50 (ICD-10-CM) - Low back pain   Rationale for Evaluation and Treatment: Rehabilitation  THERAPY DIAG:  Other low back pain  Pain in right hip  Muscle weakness (generalized)  ONSET DATE: ~1 month ago recent flare up. Has dealt with this for ~4 years.   SUBJECTIVE:                                                                                                                                                                                           SUBJECTIVE STATEMENT: Pt reports R hip/LE pain this morning when he woke up. No pain right now.   PERTINENT HISTORY:  Pt reports R shoulder is no longer an issue. Reports gradual onset of R sided LBP and "sciatica". Gradual onset, no MOI. Noticed symptoms initially with prolonged sitting and pain with getting up. 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 This is still the case with most recent flare up. Normal to have increased pain every few months. Now trying to get it fixed so he doesn't have to keep dealing with flare ups. Reports lower back stiffness and onset of sharp pain in posterolateral glutes. Reports referred pain to  knee posteriorly and laterally. Sharp pain also there. Painful activities include prolonged sitting and walking on inclines. Pain begins with sitting for an hour or two. 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 Pain is worse in the mornings. Worst pain described upwards to 5-7/10 NPS. Initially was 9/10 a few weeks ago. Denies N/T and sensation changes. Denies B/B changes. No saddle anesthesia.   PAIN:  Are you having pain? Yes: NPRS scale: none currently Pain location: R hip, lower  back Pain description: Sharp Aggravating factors: Morning time,Prolonged sitting,work tasks, walking on inclines Relieving factors: Activity initially improves  PRECAUTIONS: None  RED FLAGS: None   WEIGHT BEARING RESTRICTIONS: No  FALLS:  Has patient fallen in last 6 months? No  OCCUPATION: Special educational needs teacher  PLOF: Independent  PATIENT GOALS: Improve his pain  NEXT MD VISIT: None  OBJECTIVE:  Note: Objective measures were completed at Evaluation unless otherwise noted.  DIAGNOSTIC FINDINGS:  None   PATIENT SURVEYS:  LEFS 50/80  COGNITION: Overall cognitive status: Within functional limits for tasks assessed     SENSATION: Light touch: WFL  MUSCLE LENGTH: Thomas test: R/L positive for iliopsoas and rectus femoris Ober's: Negative on RLE  POSTURE: No Significant postural limitations  PALPATION: TTP at upper R glut max/med. TTP at R greater trochanter and along IT band   LUMBAR ROM:   AROM eval  Flexion 75%  Extension 100%  Right lateral flexion 100%  Left lateral flexion 100%  Right rotation 100%  Left rotation 100%   (Blank rows = not tested)  LOWER EXTREMITY ROM:     Active  Right eval Left eval  Hip flexion    Hip extension    Hip abduction    Hip adduction    Hip internal rotation    Hip external rotation    Knee flexion    Knee extension    Ankle dorsiflexion    Ankle plantarflexion    Ankle inversion     Ankle eversion     (Blank rows = not tested)  LOWER EXTREMITY MMT:    MMT Right eval Left eval  Hip flexion 5 5  Hip extension 3 4  Hip abduction 3+ 4  Hip adduction    Hip internal rotation 4 4  Hip external rotation 4* 5  Knee flexion 5 5  Knee extension 5 5  Ankle dorsiflexion 5 5  Ankle plantarflexion 5 5  Ankle inversion    Ankle eversion     (Blank rows = not tested)  LUMBAR SPECIAL TESTS:  Straight leg raise test: Negative, Slump test: Negative, and FABER test: Negative FADIR: Positive for concordant pain in R hip  JOINT MOBILITY: Hip A/P: normal and painless  Sacral nutation and counter nutation painless  Lumbar L5-T12 normal and painless  FUNCTIONAL TESTS:  Lateral step down: poor motor control bilaterally with hip IR and adduction leading to knee valgus moment. SLS: 30 sec/LE. No trendelenburg bilaterally Squat: deferred to next session  GAIT: Distance walked: 10 meters Assistive device utilized: None Level of assistance: Complete Independence Comments: normal reciprocal gait  TREATMENT DATE: 10/24/23  There.ex:  RLE hip flexor stretch. 3x30 sec with light R knee flexion for Rec fem bias.  R hip abduction and extension: 3x8/direction, band at ankles   Blue TB for hip abduction  GTB for prone hip extension. Min TC's for neutral pelvis on table    Supine SLR hamstring stretch with strap: 2x30 sec/LE. Min VC's for form/technique    There.Act:   Alternating lunges, 3x8/LE    Monster walks with GTB: 4x10 meters    Heel raises on 6" step: 3x10  PATIENT EDUCATION:  Education details: HEP, POC Person educated: Patient Education method: Explanation, Demonstration, and Handouts Education comprehension: verbalized understanding and needs further education  HOME EXERCISE PROGRAM: Access Code: [email or web address] URL:  [email or web address] Date: 10/24/2023 Prepared by: Ronnie Derby  Exercises - Sidelying Hip Abduction with Resistance at Thighs  - 1 x daily - 3-4 x weekly - 3 sets - 8 reps - Prone Hip Extension  - 1 x daily - 3-4 x weekly - 3 sets - 8 reps - Half Kneeling Hip Flexor Stretch  - 1 x daily - 7 x weekly - 1 sets - 3 reps - 30 hold - Supine Hamstring Stretch with Strap  - 1 x daily - 7 x weekly - 1 sets - 3 reps - 20 hold - Standard Lunge  - 1 x daily - 2-3 x weekly - 3 sets - 8 reps  Access Code: [email or web address] URL: [email or web address] Date: 10/17/2023 Prepared by: Ronnie Derby  Exercises - Sidelying Hip Abduction with Resistance at Thighs  - 1 x daily - 3-4 x weekly - 3 sets - 8 reps - Prone Hip Extension  - 1 x daily - 3-4 x weekly - 3 sets - 8 reps - Half Kneeling Hip Flexor Stretch  - 1 x daily - 7 x weekly - 1 sets - 3 reps - 30 hold  ASSESSMENT:  CLINICAL IMPRESSION: Pt arriving for first treatment session with good understanding of HEP. Continuing to address flexibility impairments and hip abductor and extension strength. Pt remains with RLE hip abduction and extension weakness reliant on VC's for form/technique to limit compensations. Updated HEP to more functional movement patterns to progress LE strength. Pt with good understanding. Pt's deficits are leading to difficulty with side sleeping, working out, and job tasks lifting heavy furniture. Pt will benefit from skilled PT services to address these deficits and maximize return to PLOF.  OBJECTIVE IMPAIRMENTS: decreased mobility, decreased strength, impaired flexibility, improper body mechanics, and pain.   ACTIVITY LIMITATIONS: sitting, standing, and sleeping  PARTICIPATION LIMITATIONS: driving and occupation  PERSONAL FACTORS: Age, Fitness, Past/current experiences, Profession, and Time since onset of injury/illness/exacerbation are also affecting patient's functional outcome.   REHAB POTENTIAL:  Fair chronic condition  CLINICAL DECISION MAKING: Stable/uncomplicated  EVALUATION COMPLEXITY: Low   GOALS: Goals reviewed with patient? No  SHORT TERM GOALS: Target date: 11/14/23  Pt will be independent with HEP to improve hip strength and flexibility. Baseline: 10/17/23: Provided initial HEP Goal status: INITIAL  LONG TERM GOALS: Target date: 12/12/23  Pt will improve LEFS by at least 9 points to demonstrate clinically significant reduction in disability due to R hip pain.  Baseline: 10/17/23: 50/80 Goal status: INITIAL  2.  Pt will report worst pain with job related tasks as a 2/10 NPS to demonstrate clinically significant reduction in pain levels. Baseline: 10/17/23: 5-7/10 NPS Goal status: INITIAL  3.  Pt will improve lateral step down without hip adduction/IR to demonstrate improved hip stability and motor control for pain free recreational activities. Baseline: 10/17/23: hip IR and adduction Goal status: INITIAL PLAN:  PT FREQUENCY: 1-2x/week  PT DURATION: 8 weeks  PLANNED INTERVENTIONS: 97164- PT Re-evaluation, 97110-Therapeutic exercises, 97530- Therapeutic activity, 97112- Neuromuscular re-education, 97535- Self Care, 16109- Manual therapy, [id number]- Electrical stimulation (unattended), [phone]- Electrical stimulation (manual), Patient/Family education, Balance training, Stair training, Dry Needling, Joint mobilization, Joint manipulation, Spinal manipulation, Spinal mobilization, Cryotherapy, and Moist heat.  PLAN FOR NEXT SESSION: Improve hip strength    Delphia Grates. Fairly IV, PT, DPT Physical  Therapist- [place]  [facility] 10/24/2023, 10:59 AM

## 2023-10-31 ENCOUNTER — Ambulatory Visit

## 2023-10-31 DIAGNOSIS — M6281 Muscle weakness (generalized): Secondary | ICD-10-CM

## 2023-10-31 DIAGNOSIS — M25551 Pain in right hip: Secondary | ICD-10-CM

## 2023-10-31 DIAGNOSIS — M5459 Other low back pain: Secondary | ICD-10-CM | POA: Diagnosis not present

## 2023-10-31 NOTE — Therapy (Signed)
 OUTPATIENT PHYSICAL THERAPY THORACOLUMBAR TREATMENT   Patient Name: Gary Graves MRN: [id number] DOB:Mar 19, 2001, 23 y.o., male Today's Date: 10/31/2023  END OF SESSION:  PT End of Session - 10/31/23 1350     Visit Number 3    Number of Visits 9    Date for PT Re-Evaluation 12/12/23    PT Start Time 1348    PT Stop Time 1429    PT Time Calculation (min) 41 min    Activity Tolerance Patient tolerated treatment well    Behavior During Therapy [facility] for tasks assessed/performed             History reviewed. No pertinent past medical history. History reviewed. No pertinent surgical history. There are no active problems to display for this patient.   PCP: Sandrea Hughs, NP  REFERRING PROVIDER: Sandrea Hughs, NP  REFERRING DIAG: [phone] (ICD-10-CM) - Right shoulder pain M54.50 (ICD-10-CM) - Low back pain   Rationale for Evaluation and Treatment: Rehabilitation  THERAPY DIAG:  Other low back pain  Pain in right hip  Muscle weakness (generalized)  ONSET DATE: ~1 month ago recent flare up. Has dealt with this for ~4 years.   SUBJECTIVE:                                                                                                                                                                                           SUBJECTIVE STATEMENT: Pt reports pain improved in R hip. Only averaging 2-3/10 NPS.    PERTINENT HISTORY:  Pt reports R shoulder is no longer an issue. Reports gradual onset of R sided LBP and "sciatica". Gradual onset, no MOI. Noticed symptoms initially with prolonged sitting and pain with getting up. 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 This is still the case with most recent flare up. Normal to have increased pain every few months. Now trying to get it fixed so he doesn't have to keep dealing with flare ups. Reports lower back stiffness and onset of sharp pain in posterolateral glutes. Reports referred pain to knee  posteriorly and laterally. Sharp pain also there. Painful activities include prolonged sitting and walking on inclines. Pain begins with sitting for an hour or two. 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 Pain is worse in the mornings. Worst pain described upwards to 5-7/10 NPS. Initially was 9/10 a few weeks ago. Denies N/T and sensation changes. Denies B/B changes. No saddle anesthesia.   PAIN:  Are you having pain? Yes: NPRS scale: none currently Pain location: R hip, lower back Pain description:  Sharp Aggravating factors: Morning time,Prolonged sitting,work tasks, walking on inclines Relieving factors: Activity initially improves  PRECAUTIONS: None  RED FLAGS: None   WEIGHT BEARING RESTRICTIONS: No  FALLS:  Has patient fallen in last 6 months? No  OCCUPATION: Special educational needs teacher  PLOF: Independent  PATIENT GOALS: Improve his pain  NEXT MD VISIT: None  OBJECTIVE:  Note: Objective measures were completed at Evaluation unless otherwise noted.  DIAGNOSTIC FINDINGS:  None   PATIENT SURVEYS:  LEFS 50/80  COGNITION: Overall cognitive status: Within functional limits for tasks assessed     SENSATION: Light touch: WFL  MUSCLE LENGTH: Thomas test: R/L positive for iliopsoas and rectus femoris Ober's: Negative on RLE  POSTURE: No Significant postural limitations  PALPATION: TTP at upper R glut max/med. TTP at R greater trochanter and along IT band   LUMBAR ROM:   AROM eval  Flexion 75%  Extension 100%  Right lateral flexion 100%  Left lateral flexion 100%  Right rotation 100%  Left rotation 100%   (Blank rows = not tested)  LOWER EXTREMITY ROM:     Active  Right eval Left eval  Hip flexion    Hip extension    Hip abduction    Hip adduction    Hip internal rotation    Hip external rotation    Knee flexion    Knee extension    Ankle dorsiflexion    Ankle plantarflexion    Ankle inversion     Ankle eversion     (Blank rows = not tested)  LOWER EXTREMITY MMT:    MMT Right eval Left eval  Hip flexion 5 5  Hip extension 3 4  Hip abduction 3+ 4  Hip adduction    Hip internal rotation 4 4  Hip external rotation 4* 5  Knee flexion 5 5  Knee extension 5 5  Ankle dorsiflexion 5 5  Ankle plantarflexion 5 5  Ankle inversion    Ankle eversion     (Blank rows = not tested)  LUMBAR SPECIAL TESTS:  Straight leg raise test: Negative, Slump test: Negative, and FABER test: Negative FADIR: Positive for concordant pain in R hip  JOINT MOBILITY: Hip A/P: normal and painless  Sacral nutation and counter nutation painless  Lumbar L5-T12 normal and painless  FUNCTIONAL TESTS:  Lateral step down: poor motor control bilaterally with hip IR and adduction leading to knee valgus moment. SLS: 30 sec/LE. No trendelenburg bilaterally Squat: deferred to next session  GAIT: Distance walked: 10 meters Assistive device utilized: None Level of assistance: Complete Independence Comments: normal reciprocal gait  TREATMENT DATE: 10/31/23  There.ex:  RLE hip flexor stretch. 3x30 sec with R knee flexed onto wall.    R hip abduction and extension: 2x8/direction, band at ankles   Blue TB for hip abduction  GTB for prone hip extension. Min TC's for neutral pelvis on table. 2x8 on RLE with knee bent to 90 degrees.  RLE hamstring SLR with strap and hip adduction to tolerance: 2x30 sec     There.Act:   Alternating lateral lunges, 2x6/LE    2x6/ lateral step downs single limb taps for eccentric lowering on steps   Sled pushes 90#, 2x75'  PATIENT EDUCATION:  Education details: HEP, POC Person educated: Patient Education method: Explanation, Demonstration, and Handouts Education comprehension: verbalized understanding and needs further education  HOME EXERCISE  PROGRAM: Access Code: [email or web address] URL: [email or web address] Date: 10/24/2023 Prepared by: Ronnie Derby  Exercises - Sidelying Hip Abduction with Resistance at Thighs  - 1 x daily - 3-4 x weekly - 3 sets - 8 reps - Prone Hip Extension  - 1 x daily - 3-4 x weekly - 3 sets - 8 reps - Half Kneeling Hip Flexor Stretch  - 1 x daily - 7 x weekly - 1 sets - 3 reps - 30 hold - Supine Hamstring Stretch with Strap  - 1 x daily - 7 x weekly - 1 sets - 3 reps - 20 hold - Standard Lunge  - 1 x daily - 2-3 x weekly - 3 sets - 8 reps  Access Code: [email or web address] URL: [email or web address] Date: 10/17/2023 Prepared by: Ronnie Derby  Exercises - Sidelying Hip Abduction with Resistance at Thighs  - 1 x daily - 3-4 x weekly - 3 sets - 8 reps - Prone Hip Extension  - 1 x daily - 3-4 x weekly - 3 sets - 8 reps - Half Kneeling Hip Flexor Stretch  - 1 x daily - 7 x weekly - 1 sets - 3 reps - 30 hold  ASSESSMENT:  CLINICAL IMPRESSION: Continuing PT POC addressing RLE flexibility and proximal hip strengthening. Incorporating knee extended and flexed to address hamstring extension and glut max biased hip extension. 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 Pt reports continuous decline in pain in R hip and good HEP compliance.  Pt will benefit from skilled PT services to address these deficits and maximize return to PLOF.  OBJECTIVE IMPAIRMENTS: decreased mobility, decreased strength, impaired flexibility, improper body mechanics, and pain.   ACTIVITY LIMITATIONS: sitting, standing, and sleeping  PARTICIPATION LIMITATIONS: driving and occupation  PERSONAL FACTORS: Age, Fitness, Past/current experiences, Profession, and Time since onset of injury/illness/exacerbation are also affecting patient's functional outcome.   REHAB POTENTIAL: Fair chronic condition  CLINICAL  DECISION MAKING: Stable/uncomplicated  EVALUATION COMPLEXITY: Low   GOALS: Goals reviewed with patient? No  SHORT TERM GOALS: Target date: 11/14/23  Pt will be independent with HEP to improve hip strength and flexibility. Baseline: 10/17/23: Provided initial HEP Goal status: INITIAL  LONG TERM GOALS: Target date: 12/12/23  Pt will improve LEFS by at least 9 points to demonstrate clinically significant reduction in disability due to R hip pain.  Baseline: 10/17/23: 50/80 Goal status: INITIAL  2.  Pt will report worst pain with job related tasks as a 2/10 NPS to demonstrate clinically significant reduction in pain levels. Baseline: 10/17/23: 5-7/10 NPS Goal status: INITIAL  3.  Pt will improve lateral step down without hip adduction/IR to demonstrate improved hip stability and motor control for pain free recreational activities. Baseline: 10/17/23: hip IR and adduction Goal status: INITIAL PLAN:  PT FREQUENCY: 1-2x/week  PT DURATION: 8 weeks  PLANNED INTERVENTIONS: 97164- PT Re-evaluation, 97110-Therapeutic exercises, 97530- Therapeutic activity, 97112- Neuromuscular re-education, 97535- Self Care, 16109- Manual therapy, G0283- Electrical stimulation (unattended), [phone]- Electrical stimulation (manual), Patient/Family education, Balance training, Stair training, Dry Needling, Joint mobilization, Joint manipulation, Spinal manipulation, Spinal mobilization, Cryotherapy, and Moist heat.  PLAN FOR NEXT SESSION: Improve hip strength    Delphia Grates. Fairly IV, PT, DPT Physical Therapist- [facility]  [facility] 10/31/2023, 2:54  PM

## 2023-11-06 ENCOUNTER — Ambulatory Visit: Payer: Medicaid Other

## 2023-11-08 ENCOUNTER — Ambulatory Visit: Payer: Medicaid Other

## 2023-11-12 ENCOUNTER — Ambulatory Visit: Payer: Medicaid Other

## 2023-11-12 ENCOUNTER — Telehealth: Payer: Self-pay

## 2023-11-12 NOTE — Telephone Encounter (Signed)
 Called patient about missed PT appointment but no answer. LVM about next appointment date and time.

## 2023-11-14 ENCOUNTER — Ambulatory Visit: Payer: Medicaid Other

## 2023-11-14 DIAGNOSIS — M5459 Other low back pain: Secondary | ICD-10-CM | POA: Diagnosis not present

## 2023-11-14 DIAGNOSIS — M25551 Pain in right hip: Secondary | ICD-10-CM

## 2023-11-14 DIAGNOSIS — M6281 Muscle weakness (generalized): Secondary | ICD-10-CM

## 2023-11-14 NOTE — Therapy (Signed)
 OUTPATIENT PHYSICAL THERAPY THORACOLUMBAR TREATMENT   Patient Name: Gary Graves MRN: [id number] DOB:[birth year], 23 y.o., male Today's Date: 11/14/2023  END OF SESSION:  PT End of Session - 11/14/23 1343     Visit Number 4    Number of Visits 9    Date for PT Re-Evaluation 12/12/23    PT Start Time 1345    PT Stop Time 1425    PT Time Calculation (min) 40 min    Activity Tolerance Patient tolerated treatment well    Behavior During Therapy [facility] for tasks assessed/performed             No past medical history on file. No past surgical history on file. There are no active problems to display for this patient.   PCP: Sandrea Hughs, NP  REFERRING PROVIDER: Sandrea Hughs, NP  REFERRING DIAG: [phone] (ICD-10-CM) - Right shoulder pain M54.50 (ICD-10-CM) - Low back pain   Rationale for Evaluation and Treatment: Rehabilitation  THERAPY DIAG:  Other low back pain  Pain in right hip  Muscle weakness (generalized)  ONSET DATE: ~1 month ago recent flare up. Has dealt with this for ~4 years.   SUBJECTIVE:                                                                                                                                                                                           SUBJECTIVE STATEMENT: Patient reports zero pain at start of session. Reports minor pain (3/10) in the AM but subsides with movement.   PERTINENT HISTORY:  Pt reports R shoulder is no longer an issue. Reports gradual onset of R sided LBP and "sciatica". Gradual onset, no MOI. Noticed symptoms initially with prolonged sitting and pain with getting up. 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 This is still the case with most recent flare up. Normal to have increased pain every few months. Now trying to get it fixed so he doesn't have to keep dealing with flare ups. Reports lower back stiffness and onset of sharp pain in posterolateral glutes. Reports referred pain  to knee posteriorly and laterally. Sharp pain also there. Painful activities include prolonged sitting and walking on inclines. Pain begins with sitting for an hour or two. 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 Pain is worse in the mornings. Worst pain described upwards to 5-7/10 NPS. Initially was 9/10 a few weeks ago. Denies N/T and sensation changes. Denies B/B changes. No saddle anesthesia.   PAIN:  Are you having pain? Yes: NPRS scale: none currently Pain location:  R hip, lower back Pain description: Sharp Aggravating factors: Morning time,Prolonged sitting,work tasks, walking on inclines Relieving factors: Activity initially improves  PRECAUTIONS: None  RED FLAGS: None   WEIGHT BEARING RESTRICTIONS: No  FALLS:  Has patient fallen in last 6 months? No  OCCUPATION: Special educational needs teacher  PLOF: Independent  PATIENT GOALS: Improve his pain  NEXT MD VISIT: None  OBJECTIVE:  Note: Objective measures were completed at Evaluation unless otherwise noted.  DIAGNOSTIC FINDINGS:  None   PATIENT SURVEYS:  LEFS 50/80  COGNITION: Overall cognitive status: Within functional limits for tasks assessed     SENSATION: Light touch: WFL  MUSCLE LENGTH: Thomas test: R/L positive for iliopsoas and rectus femoris Ober's: Negative on RLE  POSTURE: No Significant postural limitations  PALPATION: TTP at upper R glut max/med. TTP at R greater trochanter and along IT band   LUMBAR ROM:   AROM eval  Flexion 75%  Extension 100%  Right lateral flexion 100%  Left lateral flexion 100%  Right rotation 100%  Left rotation 100%   (Blank rows = not tested)  LOWER EXTREMITY ROM:     Active  Right eval Left eval  Hip flexion    Hip extension    Hip abduction    Hip adduction    Hip internal rotation    Hip external rotation    Knee flexion    Knee extension    Ankle dorsiflexion    Ankle plantarflexion    Ankle  inversion    Ankle eversion     (Blank rows = not tested)  LOWER EXTREMITY MMT:    MMT Right eval Left eval  Hip flexion 5 5  Hip extension 3 4  Hip abduction 3+ 4  Hip adduction    Hip internal rotation 4 4  Hip external rotation 4* 5  Knee flexion 5 5  Knee extension 5 5  Ankle dorsiflexion 5 5  Ankle plantarflexion 5 5  Ankle inversion    Ankle eversion     (Blank rows = not tested)  LUMBAR SPECIAL TESTS:  Straight leg raise test: Negative, Slump test: Negative, and FABER test: Negative FADIR: Positive for concordant pain in R hip  JOINT MOBILITY: Hip A/P: normal and painless  Sacral nutation and counter nutation painless  Lumbar L5-T12 normal and painless  FUNCTIONAL TESTS:  Lateral step down: poor motor control bilaterally with hip IR and adduction leading to knee valgus moment. SLS: 30 sec/LE. No trendelenburg bilaterally Squat: deferred to next session  GAIT: Distance walked: 10 meters Assistive device utilized: None Level of assistance: Complete Independence Comments: normal reciprocal gait  TREATMENT DATE: 11/14/23  There.ex:  Passive supine RLE hip flexor stretch with knee flexion targeted rectus femoris. 3x30 sec   Left Sidelying, RLE Lock Clamshell Isometric against manual resistance 3 x 8 x 6s hold   Hip Flexion marches with blue TB around feet, BUE support on wall 3x16  8" lateral step down 2 x 8 ea LE, no UE support   There.Act:   Sit to stand from mat table (20" height) with 20# KB and Blue Theraband around knees 2x8   Lateral Steps with Blue TB around ankle 3 x 32'   Sled pushes 90#, 2x75'  Lateral Lunges 2x8 ea leg, second set with 20# KB; no pain with end range hip flexion  Half kneeling Hip Flexion stretch with rear LE against support: 2 x 30s ea LE   PATIENT EDUCATION:  Education details: HEP, POC Person educated: Patient Education method:  Explanation, Demonstration, and Handouts Education comprehension: verbalized understanding and needs further education  HOME EXERCISE PROGRAM: Access Code: [email or web address] URL: [email or web address] Date: 10/24/2023 Prepared by: Ronnie Derby  Exercises - Sidelying Hip Abduction with Resistance at Thighs  - 1 x daily - 3-4 x weekly - 3 sets - 8 reps - Prone Hip Extension  - 1 x daily - 3-4 x weekly - 3 sets - 8 reps - Half Kneeling Hip Flexor Stretch  - 1 x daily - 7 x weekly - 1 sets - 3 reps - 30 hold - Supine Hamstring Stretch with Strap  - 1 x daily - 7 x weekly - 1 sets - 3 reps - 20 hold - Standard Lunge  - 1 x daily - 2-3 x weekly - 3 sets - 8 reps  Access Code: [email or web address] URL: [email or web address] Date: 10/17/2023 Prepared by: Ronnie Derby  Exercises - Sidelying Hip Abduction with Resistance at Thighs  - 1 x daily - 3-4 x weekly - 3 sets - 8 reps - Prone Hip Extension  - 1 x daily - 3-4 x weekly - 3 sets - 8 reps - Half Kneeling Hip Flexor Stretch  - 1 x daily - 7 x weekly - 1 sets - 3 reps - 30 hold  ASSESSMENT:  CLINICAL IMPRESSION: Patient arrived to physical therapy without report of additional pain. Today's treatment with a main focus on total hip strengthening and compound exercises. Pt continues to tolerate increase in resistance and intensity with LE exercises. No pain reported following session. Incorporated increased lateral step down length without report of additional pain.  Pt reports continuous decline in pain in R hip and good HEP compliance.  Pt will benefit from skilled PT services to address these deficits and maximize return to PLOF.   OBJECTIVE IMPAIRMENTS: decreased mobility, decreased strength, impaired flexibility, improper body mechanics, and pain.   ACTIVITY LIMITATIONS: sitting, standing, and sleeping  PARTICIPATION LIMITATIONS: driving and occupation  PERSONAL FACTORS: Age, Fitness, Past/current experiences, Profession, and  Time since onset of injury/illness/exacerbation are also affecting patient's functional outcome.   REHAB POTENTIAL: Fair chronic condition  CLINICAL DECISION MAKING: Stable/uncomplicated  EVALUATION COMPLEXITY: Low   GOALS: Goals reviewed with patient? No  SHORT TERM GOALS: Target date: 11/14/23  Pt will be independent with HEP to improve hip strength and flexibility. Baseline: 10/17/23: Provided initial HEP Goal status: INITIAL  LONG TERM GOALS: Target date: 12/12/23  Pt will improve LEFS by at least 9 points to demonstrate clinically significant reduction in disability due to R hip pain.  Baseline: 10/17/23: 50/80 Goal status: INITIAL  2.  Pt will report worst pain with job related tasks as a 2/10 NPS to demonstrate clinically significant reduction in pain levels. Baseline: 10/17/23: 5-7/10 NPS Goal status: INITIAL  3.  Pt will improve lateral step down without hip adduction/IR to demonstrate improved hip stability and motor control for pain free recreational activities. Baseline: 10/17/23: hip IR and adduction Goal status: INITIAL PLAN:  PT FREQUENCY: 1-2x/week  PT DURATION: 8 weeks  PLANNED INTERVENTIONS: 97164- PT Re-evaluation, 97110-Therapeutic exercises, 97530- Therapeutic activity, 97112- Neuromuscular re-education, 97535- Self Care, [id number]- Manual therapy, [id number]- Electrical stimulation (unattended), [phone]- Electrical stimulation (manual), Patient/Family education, Balance training, Stair training, Dry Needling, Joint mobilization, Joint manipulation, Spinal manipulation, Spinal mobilization, Cryotherapy, and Moist heat.  PLAN FOR NEXT SESSION:  Improve hip strength    Christene Lye PT, DPT Physical Therapist- [facility] 11/14/2023, 2:36 PM

## 2023-11-19 ENCOUNTER — Ambulatory Visit: Payer: Medicaid Other

## 2023-11-19 DIAGNOSIS — M5459 Other low back pain: Secondary | ICD-10-CM | POA: Diagnosis not present

## 2023-11-19 DIAGNOSIS — M25551 Pain in right hip: Secondary | ICD-10-CM

## 2023-11-19 DIAGNOSIS — M6281 Muscle weakness (generalized): Secondary | ICD-10-CM

## 2023-11-19 NOTE — Therapy (Signed)
 OUTPATIENT PHYSICAL THERAPY THORACOLUMBAR TREATMENT   Patient Name: Gary Graves MRN: [id number] DOB:05/15/2001, 23 y.o., male Today's Date: 11/19/2023  END OF SESSION:  PT End of Session - 11/19/23 1506     Visit Number 5    Number of Visits 9    Date for PT Re-Evaluation 12/12/23    PT Start Time 1510    PT Stop Time 1551    PT Time Calculation (min) 41 min    Activity Tolerance Patient tolerated treatment well    Behavior During Therapy [facility] for tasks assessed/performed             History reviewed. No pertinent past medical history. History reviewed. No pertinent surgical history. There are no active problems to display for this patient.   PCP: Sandrea Hughs, NP  REFERRING PROVIDER: Sandrea Hughs, NP  REFERRING DIAG: [phone] (ICD-10-CM) - Right shoulder pain M54.50 (ICD-10-CM) - Low back pain   Rationale for Evaluation and Treatment: Rehabilitation  THERAPY DIAG:  Other low back pain  Pain in right hip  Muscle weakness (generalized)  ONSET DATE: ~1 month ago recent flare up. Has dealt with this for ~4 years.   SUBJECTIVE:                                                                                                                                                                                           SUBJECTIVE STATEMENT: Patient reports zero pain at start of session. Since the last session patient hasn't experienced any pain.   PERTINENT HISTORY:  Pt reports R shoulder is no longer an issue. Reports gradual onset of R sided LBP and "sciatica". Gradual onset, no MOI. Noticed symptoms initially with prolonged sitting and pain with getting up. 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 This is still the case with most recent flare up. Normal to have increased pain every few months. Now trying to get it fixed so he doesn't have to keep dealing with flare ups. Reports lower back stiffness and onset of sharp pain in posterolateral  glutes. Reports referred pain to knee posteriorly and laterally. Sharp pain also there. Painful activities include prolonged sitting and walking on inclines. Pain begins with sitting for an hour or two. 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 Pain is worse in the mornings. Worst pain described upwards to 5-7/10 NPS. Initially was 9/10 a few weeks ago. Denies N/T and sensation changes. Denies B/B changes. No saddle anesthesia.   PAIN:  Are you having pain? Yes: NPRS scale: none currently Pain location: R  hip, lower back Pain description: Sharp Aggravating factors: Morning time,Prolonged sitting,work tasks, walking on inclines Relieving factors: Activity initially improves  PRECAUTIONS: None  RED FLAGS: None   WEIGHT BEARING RESTRICTIONS: No  FALLS:  Has patient fallen in last 6 months? No  OCCUPATION: Special educational needs teacher  PLOF: Independent  PATIENT GOALS: Improve his pain  NEXT MD VISIT: None  OBJECTIVE:  Note: Objective measures were completed at Evaluation unless otherwise noted.  DIAGNOSTIC FINDINGS:  None   PATIENT SURVEYS:  LEFS 50/80  COGNITION: Overall cognitive status: Within functional limits for tasks assessed     SENSATION: Light touch: WFL  MUSCLE LENGTH: Thomas test: R/L positive for iliopsoas and rectus femoris Ober's: Negative on RLE  POSTURE: No Significant postural limitations  PALPATION: TTP at upper R glut max/med. TTP at R greater trochanter and along IT band   LUMBAR ROM:   AROM eval  Flexion 75%  Extension 100%  Right lateral flexion 100%  Left lateral flexion 100%  Right rotation 100%  Left rotation 100%   (Blank rows = not tested)  LOWER EXTREMITY ROM:     Active  Right eval Left eval  Hip flexion    Hip extension    Hip abduction    Hip adduction    Hip internal rotation    Hip external rotation    Knee flexion    Knee extension    Ankle dorsiflexion    Ankle  plantarflexion    Ankle inversion    Ankle eversion     (Blank rows = not tested)  LOWER EXTREMITY MMT:    MMT Right eval Left eval  Hip flexion 5 5  Hip extension 3 4  Hip abduction 3+ 4  Hip adduction    Hip internal rotation 4 4  Hip external rotation 4* 5  Knee flexion 5 5  Knee extension 5 5  Ankle dorsiflexion 5 5  Ankle plantarflexion 5 5  Ankle inversion    Ankle eversion     (Blank rows = not tested)  LUMBAR SPECIAL TESTS:  Straight leg raise test: Negative, Slump test: Negative, and FABER test: Negative FADIR: Positive for concordant pain in R hip  JOINT MOBILITY: Hip A/P: normal and painless  Sacral nutation and counter nutation painless  Lumbar L5-T12 normal and painless  FUNCTIONAL TESTS:  Lateral step down: poor motor control bilaterally with hip IR and adduction leading to knee valgus moment. SLS: 30 sec/LE. No trendelenburg bilaterally Squat: deferred to next session  GAIT: Distance walked: 10 meters Assistive device utilized: None Level of assistance: Complete Independence Comments: normal reciprocal gait  TREATMENT DATE: 11/19/23  There.ex:  Left Side Plank with Right Hip Abd. Against Red TB 2x10x5sec  Hooklying SLR on RLE 2x12;   Hip Flexion marches with Black TB around feet, BUE support on wall 2 x 8 ea leg;   Matrix Multi-Hip:  Right hip Abduction 2 x 10, 40# in order to strengthen hip abductors   Right hip flexion 1 x 10 40#, 1 x 10 50# in order to strengthen hip flexors   8" lateral step down 2 x 8 ea LE, no UE support, improved technique with no hip IR or ADD present in RLE.   Additional time spent discussing POC and updated HEP for hip strengthening  There.Act:   Sit to stand from mat table (20" height) with 30# KB and Blue Theraband around knees 2x8 in order to strengthen legs and glute stability with lifting heavy objects.   Lateral  Lunges 2 x 10;  in order to improve deeper lunge and end range hip flexion when moving objects.    Half kneeling Hip Flexion stretch with rear LE against support: 3 x 30s ea LE   PATIENT EDUCATION:  Education details: HEP, POC Person educated: Patient Education method: Explanation, Demonstration, and Handouts Education comprehension: verbalized understanding and needs further education  HOME EXERCISE PROGRAM: Access Code: [email or web address] URL: [email or web address] Date: 11/19/2023 Prepared by: Cristal Deer Bahja Bence  Exercises - Sidelying Hip Abduction with Resistance at Thighs  - 1 x daily - 3-4 x weekly - 3 sets - 8 reps - Prone Hip Extension  - 1 x daily - 3-4 x weekly - 3 sets - 8 reps - Half Kneeling Hip Flexor Stretch  - 1 x daily - 7 x weekly - 1 sets - 3 reps - 30 hold - Supine Hamstring Stretch with Strap  - 1 x daily - 7 x weekly - 1 sets - 3 reps - 20 hold - Standard Lunge  - 1 x daily - 2-3 x weekly - 3 sets - 8 reps - Side Stepping with Resistance at Thighs  - 1 x daily - 2-3 x weekly - 2-3 sets - 10 reps - Squat with Resistance at Thighs  - 1 x daily - 2-3 x weekly - 2-3 sets - 10 reps  Access Code: [email or web address] URL: [email or web address] Date: 10/24/2023 Prepared by: Ronnie Derby  Exercises - Sidelying Hip Abduction with Resistance at Thighs  - 1 x daily - 3-4 x weekly - 3 sets - 8 reps - Prone Hip Extension  - 1 x daily - 3-4 x weekly - 3 sets - 8 reps - Half Kneeling Hip Flexor Stretch  - 1 x daily - 7 x weekly - 1 sets - 3 reps - 30 hold - Supine Hamstring Stretch with Strap  - 1 x daily - 7 x weekly - 1 sets - 3 reps - 20 hold - Standard Lunge  - 1 x daily - 2-3 x weekly - 3 sets - 8 reps  ASSESSMENT:  CLINICAL IMPRESSION: Patient arrived to physical therapy without report of additional pain since the last session. Today's treatment with a main focus on total hip strengthening and compound exercises. Pt tolerated increase in resistance with multi hip machine without report of additional pain in hip flexors.Lateral step down with improved  control; less hip IR and adduction in R knee. Pt reports continuous decline in pain in R hip and good HEP compliance. Added additional exercises for glute strengthening to HEP. Pt will benefit from skilled PT services to address these deficits and maximize return to PLOF.   OBJECTIVE IMPAIRMENTS: decreased mobility, decreased strength, impaired flexibility, improper body mechanics, and pain.   ACTIVITY LIMITATIONS: sitting, standing, and sleeping  PARTICIPATION LIMITATIONS: driving and occupation  PERSONAL FACTORS: Age, Fitness, Past/current experiences, Profession, and Time since onset of injury/illness/exacerbation are also affecting patient's functional outcome.   REHAB POTENTIAL: Fair chronic condition  CLINICAL DECISION MAKING: Stable/uncomplicated  EVALUATION COMPLEXITY: Low   GOALS: Goals reviewed with patient? No  SHORT TERM GOALS: Target date: 11/14/23  Pt will be independent with HEP to improve hip strength and flexibility. Baseline: 10/17/23: Provided initial HEP Goal status: INITIAL  LONG TERM GOALS: Target date: 12/12/23  Pt will improve LEFS by at least 9 points to demonstrate clinically significant reduction in disability due to R hip pain.  Baseline: 10/17/23: 50/80 Goal status: INITIAL  2.  Pt will report worst pain with job  related tasks as a 2/10 NPS to demonstrate clinically significant reduction in pain levels. Baseline: 10/17/23: 5-7/10 NPS; 11/19/2023: 3/10 Goal status: INITIAL  3.  Pt will improve lateral step down without hip adduction/IR to demonstrate improved hip stability and motor control for pain free recreational activities. Baseline: 10/17/23: hip IR and adduction Goal status: INITIAL PLAN:  PT FREQUENCY: 1-2x/week  PT DURATION: 8 weeks  PLANNED INTERVENTIONS: 97164- PT Re-evaluation, 97110-Therapeutic exercises, 97530- Therapeutic activity, 97112- Neuromuscular re-education, 97535- Self Care, 81191- Manual therapy, G0283- Electrical  stimulation (unattended), [phone]- Electrical stimulation (manual), Patient/Family education, Balance training, Stair training, Dry Needling, Joint mobilization, Joint manipulation, Spinal manipulation, Spinal mobilization, Cryotherapy, and Moist heat.  PLAN FOR NEXT SESSION: Improve hip strength, additional functional strength    Christene Lye PT, DPT Physical Therapist- [facility]  [facility] 11/19/2023, 3:55 PM

## 2023-11-21 ENCOUNTER — Ambulatory Visit: Payer: Medicaid Other

## 2023-11-26 ENCOUNTER — Ambulatory Visit: Payer: Medicaid Other

## 2023-11-28 ENCOUNTER — Ambulatory Visit: Payer: Medicaid Other

## 2023-12-03 ENCOUNTER — Telehealth: Payer: Self-pay

## 2023-12-03 ENCOUNTER — Ambulatory Visit: Payer: Medicaid Other

## 2023-12-03 NOTE — Telephone Encounter (Signed)
 Called patient about missed appointment. No answer but LVM regarding next following appointment. Advised of no show policy and to call for future rescheduling or concerns.

## 2023-12-05 ENCOUNTER — Ambulatory Visit: Payer: Medicaid Other | Attending: Primary Care

## 2023-12-05 DIAGNOSIS — M6281 Muscle weakness (generalized): Secondary | ICD-10-CM | POA: Insufficient documentation

## 2023-12-05 DIAGNOSIS — M25551 Pain in right hip: Secondary | ICD-10-CM | POA: Diagnosis present

## 2023-12-05 DIAGNOSIS — M5459 Other low back pain: Secondary | ICD-10-CM | POA: Diagnosis present

## 2023-12-05 NOTE — Therapy (Signed)
 OUTPATIENT PHYSICAL THERAPY THORACOLUMBAR TREATMENT   Patient Name: Gary Graves MRN: [id number] DOB:2000/09/19, 23 y.o., male Today's Date: 12/05/2023  END OF SESSION:  PT End of Session - 12/05/23 1352     Visit Number 6    Number of Visits 9    Date for PT Re-Evaluation 12/12/23    PT Start Time 1351    PT Stop Time 1430    PT Time Calculation (min) 39 min    Activity Tolerance Patient tolerated treatment well    Behavior During Therapy [facility] for tasks assessed/performed             No past medical history on file. No past surgical history on file. There are no active problems to display for this patient.   PCP: Ardia Kraft, NP  REFERRING PROVIDER: Ardia Kraft, NP  REFERRING DIAG: [phone] (ICD-10-CM) - Right shoulder pain M54.50 (ICD-10-CM) - Low back pain   Rationale for Evaluation and Treatment: Rehabilitation  THERAPY DIAG:  No diagnosis found.  ONSET DATE: ~1 month ago recent flare up. Has dealt with this for ~4 years.   SUBJECTIVE:                                                                                                                                                                                           SUBJECTIVE STATEMENT: Patient reports zero pain at start of session but feels tighter. "Feels tighter on one side" No questions or concerns at start of session.    PERTINENT HISTORY:  Pt reports R shoulder is no longer an issue. Reports gradual onset of R sided LBP and "sciatica". Gradual onset, no MOI. Noticed symptoms initially with prolonged sitting and pain with getting up. 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 This is still the case with most recent flare up. Normal to have increased pain every few months. Now trying to get it fixed so he doesn't have to keep dealing with flare ups. Reports lower back stiffness and onset of sharp pain in posterolateral glutes. Reports referred pain to knee posteriorly and  laterally. Sharp pain also there. Painful activities include prolonged sitting and walking on inclines. Pain begins with sitting for an hour or two. 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 Pain is worse in the mornings. Worst pain described upwards to 5-7/10 NPS. Initially was 9/10 a few weeks ago. Denies N/T and sensation changes. Denies B/B changes. No saddle anesthesia.   PAIN:  Are you having pain? Yes: NPRS scale: none currently Pain location: R hip, lower back  Pain description: Sharp Aggravating factors: Morning time,Prolonged sitting,work tasks, walking on inclines Relieving factors: Activity initially improves  PRECAUTIONS: None  RED FLAGS: None   WEIGHT BEARING RESTRICTIONS: No  FALLS:  Has patient fallen in last 6 months? No  OCCUPATION: Special educational needs teacher  PLOF: Independent  PATIENT GOALS: Improve his pain  NEXT MD VISIT: None  OBJECTIVE:  Note: Objective measures were completed at Evaluation unless otherwise noted.  DIAGNOSTIC FINDINGS:  None   PATIENT SURVEYS:  LEFS 50/80  COGNITION: Overall cognitive status: Within functional limits for tasks assessed     SENSATION: Light touch: WFL  MUSCLE LENGTH: Thomas test: R/L positive for iliopsoas and rectus femoris Ober's: Negative on RLE  POSTURE: No Significant postural limitations  PALPATION: TTP at upper R glut max/med. TTP at R greater trochanter and along IT band   LUMBAR ROM:   AROM eval  Flexion 75%  Extension 100%  Right lateral flexion 100%  Left lateral flexion 100%  Right rotation 100%  Left rotation 100%   (Blank rows = not tested)  LOWER EXTREMITY ROM:     Active  Right eval Left eval  Hip flexion    Hip extension    Hip abduction    Hip adduction    Hip internal rotation    Hip external rotation    Knee flexion    Knee extension    Ankle dorsiflexion    Ankle plantarflexion    Ankle inversion    Ankle eversion      (Blank rows = not tested)  LOWER EXTREMITY MMT:    MMT Right eval Left eval  Hip flexion 5 5  Hip extension 3 4  Hip abduction 3+ 4  Hip adduction    Hip internal rotation 4 4  Hip external rotation 4* 5  Knee flexion 5 5  Knee extension 5 5  Ankle dorsiflexion 5 5  Ankle plantarflexion 5 5  Ankle inversion    Ankle eversion     (Blank rows = not tested)  LUMBAR SPECIAL TESTS:  Straight leg raise test: Negative, Slump test: Negative, and FABER test: Negative FADIR: Positive for concordant pain in R hip  JOINT MOBILITY: Hip A/P: normal and painless  Sacral nutation and counter nutation painless  Lumbar L5-T12 normal and painless  FUNCTIONAL TESTS:  Lateral step down: poor motor control bilaterally with hip IR and adduction leading to knee valgus moment. SLS: 30 sec/LE. No trendelenburg bilaterally Squat: deferred to next session  GAIT: Distance walked: 10 meters Assistive device utilized: None Level of assistance: Complete Independence Comments: normal reciprocal gait  TREATMENT DATE: 12/05/23  There.ex:  Hip Flexion marches with Black TB around feet, BUE support on wall  1 x 10 ea leg, pt endorsed no difficulty   Matrix Multi-Hip in order to improve global hip strength:   Right hip Abduction 2 x 10, 55#  Right hip flexion 2 x 10 55#   6" lateral step down, no UE support  2 x 10 ea LE   Passive Supine Hip Flexor Stretch with PT OP at the knee:   3 x 30s RLE only   Supine SLR against PT resistance (Mod to Max Resistance at distal tibia from PT)  3 x 10, VC for power in concentric phase   There.Act:  Forward Lunge with DB in BUE in order to simulate work related tasks with carrying and lifting heavier objects:  1 x 16  5#  1 x 16  10#  Reverse Lunge with  Olympic Bar Over shoulder (25#):   2 x 10, SBA   Half Kneeling R Hip Flexor Stretch Warm Up (R knee on Airex pad)   2 x 8   VC for posterior tilt of pelvis   PATIENT EDUCATION:  Education  details: HEP, POC Person educated: Patient Education method: Explanation, Demonstration, and Handouts Education comprehension: verbalized understanding and needs further education  HOME EXERCISE PROGRAM: Access Code: [email or web address] URL: [email or web address] Date: 11/19/2023 Prepared by: Cristal Deer Jadene Stemmer  Exercises - Sidelying Hip Abduction with Resistance at Thighs  - 1 x daily - 3-4 x weekly - 3 sets - 8 reps - Prone Hip Extension  - 1 x daily - 3-4 x weekly - 3 sets - 8 reps - Half Kneeling Hip Flexor Stretch  - 1 x daily - 7 x weekly - 1 sets - 3 reps - 30 hold - Supine Hamstring Stretch with Strap  - 1 x daily - 7 x weekly - 1 sets - 3 reps - 20 hold - Standard Lunge  - 1 x daily - 2-3 x weekly - 3 sets - 8 reps - Side Stepping with Resistance at Thighs  - 1 x daily - 2-3 x weekly - 2-3 sets - 10 reps - Squat with Resistance at Thighs  - 1 x daily - 2-3 x weekly - 2-3 sets - 10 reps  Access Code: [email or web address] URL: [email or web address] Date: 10/24/2023 Prepared by: Ronnie Derby  Exercises - Sidelying Hip Abduction with Resistance at Thighs  - 1 x daily - 3-4 x weekly - 3 sets - 8 reps - Prone Hip Extension  - 1 x daily - 3-4 x weekly - 3 sets - 8 reps - Half Kneeling Hip Flexor Stretch  - 1 x daily - 7 x weekly - 1 sets - 3 reps - 30 hold - Supine Hamstring Stretch with Strap  - 1 x daily - 7 x weekly - 1 sets - 3 reps - 20 hold - Standard Lunge  - 1 x daily - 2-3 x weekly - 3 sets - 8 reps  ASSESSMENT:  CLINICAL IMPRESSION: Pt arrived to OPPT with a main focus on hip R flexor strengthening and gluteal strength. Pt tolerated exercise interventions without report of additional pain in low back or hip flexor group. Time spent with passive stretching in order to elicit proper stretch in R hip flexor. PT educated patient on dynamic warm up prior to exercise and static stretching after heavy activity or prolonged sitting. Pt continues to show steady progress  towards physical therapy goals. Based on today's performance Pt will benefit from skilled PT services to address these deficits and maximize return to PLOF.  OBJECTIVE IMPAIRMENTS: decreased mobility, decreased strength, impaired flexibility, improper body mechanics, and pain.   ACTIVITY LIMITATIONS: sitting, standing, and sleeping  PARTICIPATION LIMITATIONS: driving and occupation  PERSONAL FACTORS: Age, Fitness, Past/current experiences, Profession, and Time since onset of injury/illness/exacerbation are also affecting patient's functional outcome.   REHAB POTENTIAL: Fair chronic condition  CLINICAL DECISION MAKING: Stable/uncomplicated  EVALUATION COMPLEXITY: Low   GOALS: Goals reviewed with patient? No  SHORT TERM GOALS: Target date: 11/14/23  Pt will be independent with HEP to improve hip strength and flexibility. Baseline: 10/17/23: Provided initial HEP Goal status: INITIAL  LONG TERM GOALS: Target date: 12/12/23  Pt will improve LEFS by at least 9 points to demonstrate clinically significant reduction in disability due to R hip pain.  Baseline: 10/17/23: 50/80 Goal status: INITIAL  2.  Pt will report  worst pain with job related tasks as a 2/10 NPS to demonstrate clinically significant reduction in pain levels. Baseline: 10/17/23: 5-7/10 NPS; 11/19/2023: 3/10 Goal status: INITIAL  3.  Pt will improve lateral step down without hip adduction/IR to demonstrate improved hip stability and motor control for pain free recreational activities. Baseline: 10/17/23: hip IR and adduction Goal status: INITIAL PLAN:  PT FREQUENCY: 1-2x/week  PT DURATION: 8 weeks  PLANNED INTERVENTIONS: 97164- PT Re-evaluation, 97110-Therapeutic exercises, 97530- Therapeutic activity, 97112- Neuromuscular re-education, 97535- Self Care, [id number]- Manual therapy, G0283- Electrical stimulation (unattended), [phone]- Electrical stimulation (manual), Patient/Family education, Balance training, Stair training,  Dry Needling, Joint mobilization, Joint manipulation, Spinal manipulation, Spinal mobilization, Cryotherapy, and Moist heat.  PLAN FOR NEXT SESSION: Improve hip strength, additional functional strength    Satira Curet PT, DPT Physical Therapist- [facility] 12/05/2023, 1:52 PM

## 2023-12-11 ENCOUNTER — Telehealth: Payer: Self-pay

## 2023-12-11 ENCOUNTER — Ambulatory Visit: Payer: Medicaid Other

## 2023-12-11 NOTE — Telephone Encounter (Signed)
 Called patient regarding missed appt. Pt advised that he got schedule mixed up. Will return to OPPT in following appt. Still agreeable to d/c from PT. No questions or concerns at end of call.

## 2023-12-11 NOTE — Therapy (Deleted)
 OUTPATIENT PHYSICAL THERAPY THORACOLUMBAR TREATMENT/RECERTIFICATION/DISCHARGE   Patient Name: Gary Graves MRN: [id number] DOB:Aug 28, 2000, 23 y.o., male Today's Date: 12/11/2023  END OF SESSION:    No past medical history on file. No past surgical history on file. There are no active problems to display for this patient.   PCP: Ardia Kraft, NP  REFERRING PROVIDER: Ardia Kraft, NP  REFERRING DIAG: [phone] (ICD-10-CM) - Right shoulder pain M54.50 (ICD-10-CM) - Low back pain   Rationale for Evaluation and Treatment: Rehabilitation  THERAPY DIAG:  Pain in right hip  Muscle weakness (generalized)  Other low back pain  ONSET DATE: ~1 month ago recent flare up. Has dealt with this for ~4 years.   SUBJECTIVE:                                                                                                                                                                                           SUBJECTIVE STATEMENT: ***. No Questions or concerns.   PERTINENT HISTORY:  Pt reports R shoulder is no longer an issue. Reports gradual onset of R sided LBP and "sciatica". Gradual onset, no MOI. Noticed symptoms initially with prolonged sitting and pain with getting up. 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 This is still the case with most recent flare up. Normal to have increased pain every few months. Now trying to get it fixed so he doesn't have to keep dealing with flare ups. Reports lower back stiffness and onset of sharp pain in posterolateral glutes. Reports referred pain to knee posteriorly and laterally. Sharp pain also there. Painful activities include prolonged sitting and walking on inclines. Pain begins with sitting for an hour or two. 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 Pain is worse in the mornings. Worst pain described upwards to 5-7/10 NPS. Initially was 9/10 a few weeks ago.  Denies N/T and sensation changes. Denies B/B changes. No saddle anesthesia.   PAIN:  Are you having pain? Yes: NPRS scale: none currently Pain location: R hip, lower back Pain description: Sharp Aggravating factors: Morning time,Prolonged sitting,work tasks, walking on inclines Relieving factors: Activity initially improves  PRECAUTIONS: None  RED FLAGS: None   WEIGHT BEARING RESTRICTIONS: No  FALLS:  Has patient fallen in last 6 months? No  OCCUPATION: Special educational needs teacher  PLOF: Independent  PATIENT GOALS: Improve his pain  NEXT MD VISIT: None  OBJECTIVE:  Note: Objective measures were completed at Evaluation unless otherwise noted.  DIAGNOSTIC FINDINGS:  None   PATIENT SURVEYS:  LEFS 50/80  COGNITION: Overall cognitive status: Within functional  limits for tasks assessed     SENSATION: Light touch: WFL  MUSCLE LENGTH: Thomas test: R/L positive for iliopsoas and rectus femoris Ober's: Negative on RLE  POSTURE: No Significant postural limitations  PALPATION: TTP at upper R glut max/med. TTP at R greater trochanter and along IT band   LUMBAR ROM:   AROM eval  Flexion 75%  Extension 100%  Right lateral flexion 100%  Left lateral flexion 100%  Right rotation 100%  Left rotation 100%   (Blank rows = not tested)  LOWER EXTREMITY ROM:     Active  Right eval Left eval  Hip flexion    Hip extension    Hip abduction    Hip adduction    Hip internal rotation    Hip external rotation    Knee flexion    Knee extension    Ankle dorsiflexion    Ankle plantarflexion    Ankle inversion    Ankle eversion     (Blank rows = not tested)  LOWER EXTREMITY MMT:    MMT Right eval Left eval  Hip flexion 5 5  Hip extension 3 4  Hip abduction 3+ 4  Hip adduction    Hip internal rotation 4 4  Hip external rotation 4* 5  Knee flexion 5 5  Knee extension 5 5  Ankle dorsiflexion 5 5  Ankle plantarflexion 5 5  Ankle inversion    Ankle eversion     (Blank  rows = not tested)  LUMBAR SPECIAL TESTS:  Straight leg raise test: Negative, Slump test: Negative, and FABER test: Negative FADIR: Positive for concordant pain in R hip  JOINT MOBILITY: Hip A/P: normal and painless  Sacral nutation and counter nutation painless  Lumbar L5-T12 normal and painless  FUNCTIONAL TESTS:  Lateral step down: poor motor control bilaterally with hip IR and adduction leading to knee valgus moment. SLS: 30 sec/LE. No trendelenburg bilaterally Squat: deferred to next session  GAIT: Distance walked: 10 meters Assistive device utilized: None Level of assistance: Complete Independence Comments: normal reciprocal gait  TREATMENT DATE: 12/11/23  There.ex:  Hip Flexion marches with Black TB around feet, BUE support on wall  1 x 10 ea leg, pt endorsed no difficulty   Matrix Multi-Hip in order to improve global hip strength:   Right hip Abduction 2 x 10, 55#  Right hip flexion 2 x 10 55#   6" lateral step down, no UE support  2 x 10 ea LE   Passive Supine Hip Flexor Stretch with PT OP at the knee:   3 x 30s RLE only   Supine SLR against PT resistance (Mod to Max Resistance at distal tibia from PT)  3 x 10, VC for power in concentric phase   There.Act:  Forward Lunge with DB in BUE in order to simulate work related tasks with carrying and lifting heavier objects:  1 x 16  5#  1 x 16  10#  Reverse Lunge with Olympic Bar Over shoulder (25#):   2 x 10, SBA   Half Kneeling R Hip Flexor Stretch Warm Up (R knee on Airex pad)   2 x 8   VC for posterior tilt of pelvis   PATIENT EDUCATION:  Education details: HEP, POC Person educated: Patient Education method: Explanation, Demonstration, and Handouts Education comprehension: verbalized understanding and needs further education  HOME EXERCISE PROGRAM: Access Code: [email or web address] URL: [email or web address] Date: 12/11/2023 Prepared by: Satira Curet  Exercises - Half Kneeling Hip Flexor  Stretch  -  1 x daily - 7 x weekly - 1 sets - 3 reps - 30 hold - Supine Hamstring Stretch with Strap  - 1 x daily - 7 x weekly - 1 sets - 3 reps - 20 hold - Standard Lunge  - 1 x daily - 2-3 x weekly - 3 sets - 10-12 reps - Squat with Resistance at Thighs  - 1 x daily - 2-3 x weekly - 2-3 sets - 10-12 reps - Side Stepping with Resistance at Feet  - 1 x daily - 2-3 x weekly - 2-3 sets - 10-12 reps  Access Code: [email or web address] URL: [email or web address] Date: 11/19/2023 Prepared by: Veryl Gottron Krystian Ferrentino  Exercises - Sidelying Hip Abduction with Resistance at Thighs  - 1 x daily - 3-4 x weekly - 3 sets - 8 reps - Prone Hip Extension  - 1 x daily - 3-4 x weekly - 3 sets - 8 reps - Half Kneeling Hip Flexor Stretch  - 1 x daily - 7 x weekly - 1 sets - 3 reps - 30 hold - Supine Hamstring Stretch with Strap  - 1 x daily - 7 x weekly - 1 sets - 3 reps - 20 hold - Standard Lunge  - 1 x daily - 2-3 x weekly - 3 sets - 8 reps - Side Stepping with Resistance at Thighs  - 1 x daily - 2-3 x weekly - 2-3 sets - 10 reps - Squat with Resistance at Thighs  - 1 x daily - 2-3 x weekly - 2-3 sets - 10 reps  Access Code: [email or web address] URL: [email or web address] Date: 10/24/2023 Prepared by: Lyda Samples  Exercises - Sidelying Hip Abduction with Resistance at Thighs  - 1 x daily - 3-4 x weekly - 3 sets - 8 reps - Prone Hip Extension  - 1 x daily - 3-4 x weekly - 3 sets - 8 reps - Half Kneeling Hip Flexor Stretch  - 1 x daily - 7 x weekly - 1 sets - 3 reps - 30 hold - Supine Hamstring Stretch with Strap  - 1 x daily - 7 x weekly - 1 sets - 3 reps - 20 hold - Standard Lunge  - 1 x daily - 2-3 x weekly - 3 sets - 8 reps  ASSESSMENT:  CLINICAL IMPRESSION: Pt arrives to OPPT with a continued focus on improving hip ROM and strength. PT reassessed progress towards goals due to insurance restrictions and nearing end of POC. Pt demonstrated improvements with Bilateral lateral step down; controlled  without hip IR/Add and able to perform multiple repetitions without report of pain. Pt self reported *** on LEFS (see above for score) indicating *** disability with recreational and functional movements. Patient's pain has significantly improved since his initial visit; Pt rated worst pain in last 7 days as ***/10. Based on today's performance, Pt has met all remaining goals and demonstrates overall improvements in LE ROM, strength and pain. The patient has achieved independence with daily activities and is able to perform exercises with proper technique. PT believes pt no longer require skilled physical therapy interventions and are discharged with a home exercise program to maintain progress. No question at end of session. Pt encouraged to follow-up with their primary care provider is recommended if any issues arise      OBJECTIVE IMPAIRMENTS: decreased mobility, decreased strength, impaired flexibility, improper body mechanics, and pain.   ACTIVITY LIMITATIONS: sitting, standing, and sleeping  PARTICIPATION LIMITATIONS: driving and occupation  PERSONAL FACTORS: Age, Fitness, Past/current experiences, Profession,  and Time since onset of injury/illness/exacerbation are also affecting patient's functional outcome.   REHAB POTENTIAL: Fair chronic condition  CLINICAL DECISION MAKING: Stable/uncomplicated  EVALUATION COMPLEXITY: Low   GOALS: Goals reviewed with patient? No  SHORT TERM GOALS: Target date: 11/14/23  Pt will be independent with HEP to improve hip strength and flexibility. Baseline: 10/17/23: Provided initial HEP Goal status: INITIAL  LONG TERM GOALS: Target date: 12/12/23  Pt will improve LEFS by at least 9 points to demonstrate clinically significant reduction in disability due to R hip pain.  Baseline: 10/17/23: 50/80 Goal status: INITIAL  2.  Pt will report worst pain with job related tasks as a 2/10 NPS to demonstrate clinically significant reduction in pain  levels. Baseline: 10/17/23: 5-7/10 NPS; 11/19/2023: 3/10 Goal status: INITIAL  3.  Pt will improve lateral step down without hip adduction/IR to demonstrate improved hip stability and motor control for pain free recreational activities. Baseline: 10/17/23: hip IR and adduction Goal status: INITIAL PLAN:  PT FREQUENCY: 1-2x/week  PT DURATION: 8 weeks  PLANNED INTERVENTIONS: 97164- PT Re-evaluation, 97110-Therapeutic exercises, 97530- Therapeutic activity, 97112- Neuromuscular re-education, 97535- Self Care, 95621- Manual therapy, G0283- Electrical stimulation (unattended), [phone]- Electrical stimulation (manual), Patient/Family education, Balance training, Stair training, Dry Needling, Joint mobilization, Joint manipulation, Spinal manipulation, Spinal mobilization, Cryotherapy, and Moist heat.  PLAN FOR NEXT SESSION: Improve hip strength, additional functional strength    Satira Curet PT, DPT Physical Therapist- [place]  [facility] 12/11/2023, 1:12 PM

## 2023-12-13 ENCOUNTER — Ambulatory Visit: Payer: Medicaid Other

## 2023-12-13 DIAGNOSIS — M25551 Pain in right hip: Secondary | ICD-10-CM

## 2023-12-13 DIAGNOSIS — M6281 Muscle weakness (generalized): Secondary | ICD-10-CM

## 2023-12-13 DIAGNOSIS — M5459 Other low back pain: Secondary | ICD-10-CM

## 2023-12-13 NOTE — Therapy (Signed)
 OUTPATIENT PHYSICAL THERAPY THORACOLUMBAR TREATMENT/RECERTIFICATION/DISCHARGE   Patient Name: Gary Graves MRN: [id number] DOB:06/10/2001, 23 y.o., male Today's Date: 12/13/2023  END OF SESSION:  PT End of Session - 12/13/23 1332     Visit Number 7    Number of Visits 9    Date for PT Re-Evaluation 12/20/23    PT Start Time 1332    PT Stop Time 1415    PT Time Calculation (min) 43 min    Activity Tolerance Patient tolerated treatment well    Behavior During Therapy [facility] for tasks assessed/performed              No past medical history on file. No past surgical history on file. There are no active problems to display for this patient.   PCP: Ardia Kraft, NP  REFERRING PROVIDER: Ardia Kraft, NP  REFERRING DIAG: [phone] (ICD-10-CM) - Right shoulder pain M54.50 (ICD-10-CM) - Low back pain   Rationale for Evaluation and Treatment: Rehabilitation  THERAPY DIAG:  Pain in right hip  Muscle weakness (generalized)  Other low back pain  ONSET DATE: ~1 month ago recent flare up. Has dealt with this for ~4 years.   SUBJECTIVE:                                                                                                                                                                                           SUBJECTIVE STATEMENT:   Patient excited to discharge from OPPT. Adherent with HEP and gym program, currently no pain in R hip. No Questions or concerns.   PERTINENT HISTORY:  Pt reports R shoulder is no longer an issue. Reports gradual onset of R sided LBP and "sciatica". Gradual onset, no MOI. Noticed symptoms initially with prolonged sitting and pain with getting up. 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 This is still the case with most recent flare up. Normal to have increased pain every few months. Now trying to get it fixed so he doesn't have to keep dealing with flare ups. Reports lower back stiffness and onset of sharp pain  in posterolateral glutes. Reports referred pain to knee posteriorly and laterally. Sharp pain also there. Painful activities include prolonged sitting and walking on inclines. Pain begins with sitting for an hour or two. 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 Pain is worse in the mornings. Worst pain described upwards to 5-7/10 NPS. Initially was 9/10 a few weeks ago. Denies N/T and sensation changes. Denies B/B changes. No saddle anesthesia.   PAIN:  Are you having pain? Yes:  NPRS scale: none currently Pain location: R hip, lower back Pain description: Sharp Aggravating factors: Morning time,Prolonged sitting,work tasks, walking on inclines Relieving factors: Activity initially improves  PRECAUTIONS: None  RED FLAGS: None   WEIGHT BEARING RESTRICTIONS: No  FALLS:  Has patient fallen in last 6 months? No  OCCUPATION: Special educational needs teacher  PLOF: Independent  PATIENT GOALS: Improve his pain  NEXT MD VISIT: None  OBJECTIVE:  Note: Objective measures were completed at Evaluation unless otherwise noted.  DIAGNOSTIC FINDINGS:  None   PATIENT SURVEYS:  LEFS 50/80  COGNITION: Overall cognitive status: Within functional limits for tasks assessed     SENSATION: Light touch: WFL  MUSCLE LENGTH: Thomas test: R/L positive for iliopsoas and rectus femoris Ober's: Negative on RLE  POSTURE: No Significant postural limitations  PALPATION: TTP at upper R glut max/med. TTP at R greater trochanter and along IT band   LUMBAR ROM:   AROM eval  Flexion 75%  Extension 100%  Right lateral flexion 100%  Left lateral flexion 100%  Right rotation 100%  Left rotation 100%   (Blank rows = not tested)  LOWER EXTREMITY ROM:     Active  Right eval Left eval  Hip flexion    Hip extension    Hip abduction    Hip adduction    Hip internal rotation    Hip external rotation    Knee flexion    Knee extension    Ankle  dorsiflexion    Ankle plantarflexion    Ankle inversion    Ankle eversion     (Blank rows = not tested)  LOWER EXTREMITY MMT:    MMT Right eval Left eval  Hip flexion 5 5  Hip extension 3 4  Hip abduction 3+ 4  Hip adduction    Hip internal rotation 4 4  Hip external rotation 4* 5  Knee flexion 5 5  Knee extension 5 5  Ankle dorsiflexion 5 5  Ankle plantarflexion 5 5  Ankle inversion    Ankle eversion     (Blank rows = not tested)  LUMBAR SPECIAL TESTS:  Straight leg raise test: Negative, Slump test: Negative, and FABER test: Negative FADIR: Positive for concordant pain in R hip  JOINT MOBILITY: Hip A/P: normal and painless  Sacral nutation and counter nutation painless  Lumbar L5-T12 normal and painless  FUNCTIONAL TESTS:  Lateral step down: poor motor control bilaterally with hip IR and adduction leading to knee valgus moment. SLS: 30 sec/LE. No trendelenburg bilaterally Squat: deferred to next session  GAIT: Distance walked: 10 meters Assistive device utilized: None Level of assistance: Complete Independence Comments: normal reciprocal gait  TREATMENT DATE: 12/13/23  There.ex:  Matrix Multi-Hip in order to improve global hip strength:   Right hip Abduction 2 x 10, 70#  Right hip flexion 2 x 10 70#   6" lateral step down from "StepUp", no UE support  2 x 10 ea LE   Forward Lunge with front leg into hip abd against yoga ball on wall (Captain Morgan Lunge)  1 x 10s ea leg   Supine Extended Bridge on Heels for hamstring strength   2 x 10   There.Act:  Forward Lunge with DB  in order to simulate work related tasks with carrying and lifting heavier objects:  1 x 20' 10# ea UE  1 x 20' 10# ea UE  1 x 20' 10# ea UE  Kettlebell Swing in order to promote gluteal strength and core stability   1  x 10, 20#   1 x 10, 20#  1 x 10, 20 #  Half Kneeling R/L Hip Flexor Stretch Warm Up (Rear knee on Airex pad)   2 x 8 ea leg   Standing alternating Hip  Flexion Marches against resistance with alternating overhead press.  2 x 10 with Red TB at feet and 7# DB   Reassessment of lateral Step down R/L:   Performed 5x without Hip IR/ADD  LEFS: 78 / 80 = 97.5 %  Worst Pain in R Hip - 0/10 NPS   PATIENT EDUCATION:  Education details: HEP, POC Person educated: Patient Education method: Explanation, Demonstration, and Handouts Education comprehension: verbalized understanding and needs further education  HOME EXERCISE PROGRAM: Access Code: [email or web address] URL: [email or web address] Date: 12/11/2023 Prepared by: Satira Curet  Exercises - Half Kneeling Hip Flexor Stretch  - 1 x daily - 7 x weekly - 1 sets - 3 reps - 30 hold - Supine Hamstring Stretch with Strap  - 1 x daily - 7 x weekly - 1 sets - 3 reps - 20 hold - Standard Lunge  - 1 x daily - 2-3 x weekly - 3 sets - 10-12 reps - Squat with Resistance at Thighs  - 1 x daily - 2-3 x weekly - 2-3 sets - 10-12 reps - Side Stepping with Resistance at Feet  - 1 x daily - 2-3 x weekly - 2-3 sets - 10-12 reps  Access Code: [email or web address] URL: [email or web address] Date: 11/19/2023 Prepared by: Veryl Gottron Sunni Richardson  Exercises - Sidelying Hip Abduction with Resistance at Thighs  - 1 x daily - 3-4 x weekly - 3 sets - 8 reps - Prone Hip Extension  - 1 x daily - 3-4 x weekly - 3 sets - 8 reps - Half Kneeling Hip Flexor Stretch  - 1 x daily - 7 x weekly - 1 sets - 3 reps - 30 hold - Supine Hamstring Stretch with Strap  - 1 x daily - 7 x weekly - 1 sets - 3 reps - 20 hold - Standard Lunge  - 1 x daily - 2-3 x weekly - 3 sets - 8 reps - Side Stepping with Resistance at Thighs  - 1 x daily - 2-3 x weekly - 2-3 sets - 10 reps - Squat with Resistance at Thighs  - 1 x daily - 2-3 x weekly - 2-3 sets - 10 reps  Access Code: [email or web address] URL: [email or web address] Date: 10/24/2023 Prepared by: Lyda Samples  Exercises - Sidelying Hip Abduction with Resistance at Thighs  - 1 x  daily - 3-4 x weekly - 3 sets - 8 reps - Prone Hip Extension  - 1 x daily - 3-4 x weekly - 3 sets - 8 reps - Half Kneeling Hip Flexor Stretch  - 1 x daily - 7 x weekly - 1 sets - 3 reps - 30 hold - Supine Hamstring Stretch with Strap  - 1 x daily - 7 x weekly - 1 sets - 3 reps - 20 hold - Standard Lunge  - 1 x daily - 2-3 x weekly - 3 sets - 8 reps  ASSESSMENT:  CLINICAL IMPRESSION: Pt arrives to OPPT with a continued focus on improving hip ROM and strength. PT reassessed progress towards goals due to insurance restrictions and nearing end of POC. Pt demonstrated improvements with Bilateral lateral step down; controlled without hip IR/Add and able to perform multiple repetitions without report of pain. Pt self reported 78 / 80 = 97.5 % on LEFS  indicating minimal to no disability with recreational and functional movements. Patient's pain has significantly improved since his initial visit; Pt rated worst pain in last 7 days as 0/10 in his R hip. Based on today's performance, Pt has met all remaining goals and demonstrates overall improvements in LE ROM, strength and pain. He tolerated all higher level LE exercises without exacerbation of pain. The patient has achieved independence with daily activities and is able to perform exercises with proper technique. PT believes pt no longer require skilled physical therapy interventions and are discharged with a home exercise program to maintain progress. No question at end of session. Pt encouraged to follow-up with their primary care provider is recommended if any issues arise      OBJECTIVE IMPAIRMENTS: decreased mobility, decreased strength, impaired flexibility, improper body mechanics, and pain.   ACTIVITY LIMITATIONS: sitting, standing, and sleeping  PARTICIPATION LIMITATIONS: driving and occupation  PERSONAL FACTORS: Age, Fitness, Past/current experiences, Profession, and Time since onset of injury/illness/exacerbation are also affecting patient's  functional outcome.   REHAB POTENTIAL: Fair chronic condition  CLINICAL DECISION MAKING: Stable/uncomplicated  EVALUATION COMPLEXITY: Low   GOALS: Goals reviewed with patient? No  SHORT TERM GOALS: Target date: 11/14/23  Pt will be independent with HEP to improve hip strength and flexibility. Baseline: 10/17/23: Provided initial HEP Goal status: Met   LONG TERM GOALS: Target date: 12/20/23  Pt will improve LEFS by at least 9 points to demonstrate clinically significant reduction in disability due to R hip pain.  Baseline: 10/17/23: 50/80 Goal status: Met   2.  Pt will report worst pain with job related tasks as a 2/10 NPS to demonstrate clinically significant reduction in pain levels. Baseline: 10/17/23: 5-7/10 NPS; 11/19/2023: 3/10; 12/13/2023: 0/10  Goal status: Met  3.  Pt will improve lateral step down without hip adduction/IR to demonstrate improved hip stability and motor control for pain free recreational activities. Baseline: 10/17/23: hip IR and adduction; 12/13/2023: No Hip IR/add present in R/L Goal status: Met  PLAN:  PT FREQUENCY: 1-2x/week  PT DURATION: 8 weeks  PLANNED INTERVENTIONS: 97164- PT Re-evaluation, 97110-Therapeutic exercises, 97530- Therapeutic activity, 97112- Neuromuscular re-education, 97535- Self Care, 16109- Manual therapy, [id number]- Electrical stimulation (unattended), [id number]- Electrical stimulation (manual), Patient/Family education, Balance training, Stair training, Dry Needling, Joint mobilization, Joint manipulation, Spinal manipulation, Spinal mobilization, Cryotherapy, and Moist heat.  PLAN FOR NEXT SESSION: Discharge   Satira Curet PT, DPT Physical Therapist- [facility]  [facility] 12/13/2023, 1:57 PM

## 2023-12-17 ENCOUNTER — Telehealth: Admitting: Physician Assistant

## 2023-12-17 ENCOUNTER — Ambulatory Visit: Payer: Medicaid Other

## 2023-12-17 DIAGNOSIS — M5442 Lumbago with sciatica, left side: Secondary | ICD-10-CM | POA: Diagnosis not present

## 2023-12-17 DIAGNOSIS — G8929 Other chronic pain: Secondary | ICD-10-CM

## 2023-12-17 DIAGNOSIS — M5441 Lumbago with sciatica, right side: Secondary | ICD-10-CM

## 2023-12-17 MED ORDER — NAPROXEN 500 MG PO TABS: 500.0000 mg | ORAL_TABLET | Freq: Two times a day (BID) | ORAL | 0 refills | Status: AC

## 2023-12-17 MED ORDER — CYCLOBENZAPRINE HCL 10 MG PO TABS: 5.0000 mg | ORAL_TABLET | Freq: Three times a day (TID) | ORAL | 0 refills | Status: AC | PRN

## 2023-12-17 NOTE — Progress Notes (Signed)
 E-Visit for Back Pain   We are sorry that you are not feeling well.  Here is how we plan to help!  Based on what you have shared with me it looks like you mostly have acute back pain.  Acute back pain is defined as musculoskeletal pain that can resolve in 1-3 weeks with conservative treatment.  I have prescribed Naprosyn  500 mg take one by mouth twice a day non-steroid anti-inflammatory (NSAID) as well as Flexeril 10 mg every eight hours as needed which is a muscle relaxer  Some patients experience stomach irritation or in increased heartburn with anti-inflammatory drugs.  Please keep in mind that muscle relaxer's can cause fatigue and should not be taken while at work or driving.  Back pain is very common.  The pain often gets better over time.  The cause of back pain is usually not dangerous.  Most people can learn to manage their back pain on their own.  We are unable to place referrals, order imaging or labs, through the virtual urgent care department. I do recommend for you to be seen in person for this if needed.  Home Care Stay active.  Start with short walks on flat ground if you can.  Try to walk farther each day. Do not sit, drive or stand in one place for more than 30 minutes.  Do not stay in bed. Do not avoid exercise or work.  Activity can help your back heal faster. Be careful when you bend or lift an object.  Bend at your knees, keep the object close to you, and do not twist. Sleep on a firm mattress.  Lie on your side, and bend your knees.  If you lie on your back, put a pillow under your knees. Only take medicines as told by your doctor. Put ice on the injured area. Put ice in a plastic bag Place a towel between your skin and the bag Leave the ice on for 15-20 minutes, 3-4 times a day for the first 2-3 days. 210 After that, you can switch between ice and heat packs. Ask your doctor about back exercises or massage. Avoid feeling anxious or stressed.  Find good ways to deal  with stress, such as exercise.  Get Help Right Way If: Your pain does not go away with rest or medicine. Your pain does not go away in 1 week. You have new problems. You do not feel well. The pain spreads into your legs. You cannot control when you poop (bowel movement) or pee (urinate) You feel sick to your stomach (nauseous) or throw up (vomit) You have belly (abdominal) pain. You feel like you may pass out (faint). If you develop a fever.  Make Sure you: Understand these instructions. Will watch your condition Will get help right away if you are not doing well or get worse.  Your e-visit answers were reviewed by a board certified advanced clinical practitioner to complete your personal care plan.  Depending on the condition, your plan could have included both over the counter or prescription medications.  If there is a problem please reply  once you have received a response from your provider.  Your safety is important to us .  If you have drug allergies check your prescription carefully.    You can use MyChart to ask questions about today's visit, request a non-urgent call back, or ask for a work or school excuse for 24 hours related to this e-Visit. If it has been greater than 24 hours  you will need to follow up with your provider, or enter a new e-Visit to address those concerns.  You will get an e-mail in the next two days asking about your experience.  I hope that your e-visit has been valuable and will speed your recovery. Thank you for using e-visits.    I have spent 5 minutes in review of e-visit questionnaire, review and updating patient chart, medical decision making and response to patient.   Angelia Kelp, PA-C

## 2023-12-19 ENCOUNTER — Ambulatory Visit: Payer: Medicaid Other

## 2023-12-24 ENCOUNTER — Ambulatory Visit: Payer: Medicaid Other
